# Patient Record
Sex: Male | Born: 1960 | Race: White | Hispanic: No | Marital: Married | Smoking: Former smoker
Health system: Southern US, Community
[De-identification: ages and names within clinical notes are randomized; demographics above are authoritative.]

## PROBLEM LIST (undated history)

## (undated) DIAGNOSIS — Z860100 Personal history of colon polyps, unspecified: Secondary | ICD-10-CM

## (undated) DIAGNOSIS — J449 Chronic obstructive pulmonary disease, unspecified: Secondary | ICD-10-CM

## (undated) DIAGNOSIS — J302 Other seasonal allergic rhinitis: Secondary | ICD-10-CM

## (undated) DIAGNOSIS — Z8719 Personal history of other diseases of the digestive system: Secondary | ICD-10-CM

## (undated) DIAGNOSIS — N433 Hydrocele, unspecified: Secondary | ICD-10-CM

## (undated) HISTORY — DX: Chronic obstructive pulmonary disease, unspecified: J44.9

## (undated) HISTORY — PX: COLONOSCOPY: SHX174

---
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
## 1999-02-05 ENCOUNTER — Ambulatory Visit (HOSPITAL_BASED_OUTPATIENT_CLINIC_OR_DEPARTMENT_OTHER): Admission: RE | Admit: 1999-02-05 | Discharge: 1999-02-05 | Payer: Self-pay | Admitting: Orthopedic Surgery

## 2005-07-18 ENCOUNTER — Ambulatory Visit (HOSPITAL_BASED_OUTPATIENT_CLINIC_OR_DEPARTMENT_OTHER): Admission: RE | Admit: 2005-07-18 | Discharge: 2005-07-18 | Payer: Self-pay | Admitting: Orthopedic Surgery

## 2005-07-18 ENCOUNTER — Encounter (INDEPENDENT_AMBULATORY_CARE_PROVIDER_SITE_OTHER): Payer: Self-pay | Admitting: *Deleted

## 2005-07-18 HISTORY — PX: OTHER SURGICAL HISTORY: SHX169

## 2007-05-28 ENCOUNTER — Encounter: Payer: Self-pay | Admitting: Family Medicine

## 2008-08-24 ENCOUNTER — Ambulatory Visit: Payer: Self-pay | Admitting: Family Medicine

## 2008-08-24 DIAGNOSIS — L84 Corns and callosities: Secondary | ICD-10-CM

## 2008-09-26 ENCOUNTER — Ambulatory Visit: Payer: Self-pay | Admitting: Family Medicine

## 2008-09-26 LAB — CONVERTED CEMR LAB
ALT: 18 units/L (ref 0–53)
AST: 20 units/L (ref 0–37)
Alkaline Phosphatase: 46 units/L (ref 39–117)
BUN: 15 mg/dL (ref 6–23)
Basophils Relative: 0.6 % (ref 0.0–3.0)
Bilirubin, Direct: 0 mg/dL (ref 0.0–0.3)
Chloride: 106 meq/L (ref 96–112)
Cholesterol: 190 mg/dL (ref 0–200)
Creatinine, Ser: 1.1 mg/dL (ref 0.4–1.5)
Eosinophils Relative: 2.3 % (ref 0.0–5.0)
GFR calc non Af Amer: 75.75 mL/min (ref >60)
Ketones, urine, test strip: NEGATIVE
LDL Cholesterol: 138 mg/dL — ABNORMAL HIGH (ref 0–99)
Lymphocytes Relative: 21.1 % (ref 12.0–46.0)
MCV: 96.2 fL (ref 78.0–100.0)
Monocytes Relative: 6.5 % (ref 3.0–12.0)
Neutrophils Relative %: 69.5 % (ref 43.0–77.0)
Nitrite: NEGATIVE
Platelets: 197 10*3/uL (ref 150.0–400.0)
Protein, U semiquant: NEGATIVE
RBC: 4.75 M/uL (ref 4.22–5.81)
Total Bilirubin: 0.6 mg/dL (ref 0.3–1.2)
Total CHOL/HDL Ratio: 5
Total Protein: 6.7 g/dL (ref 6.0–8.3)
Triglycerides: 58 mg/dL (ref 0.0–149.0)
Urobilinogen, UA: 0.2
VLDL: 11.6 mg/dL (ref 0.0–40.0)
WBC: 9.7 10*3/uL (ref 4.5–10.5)

## 2008-10-02 ENCOUNTER — Ambulatory Visit: Payer: Self-pay | Admitting: Family Medicine

## 2008-10-30 ENCOUNTER — Ambulatory Visit: Payer: Self-pay | Admitting: Family Medicine

## 2008-10-30 DIAGNOSIS — D239 Other benign neoplasm of skin, unspecified: Secondary | ICD-10-CM | POA: Insufficient documentation

## 2009-11-02 ENCOUNTER — Ambulatory Visit: Payer: Self-pay | Admitting: Family Medicine

## 2009-11-02 LAB — CONVERTED CEMR LAB
ALT: 20 units/L (ref 0–53)
AST: 19 units/L (ref 0–37)
BUN: 19 mg/dL (ref 6–23)
Basophils Relative: 0.8 % (ref 0.0–3.0)
Bilirubin, Direct: 0.1 mg/dL (ref 0.0–0.3)
Blood in Urine, dipstick: NEGATIVE
Eosinophils Relative: 2.8 % (ref 0.0–5.0)
GFR calc non Af Amer: 72.36 mL/min (ref >60)
Glucose, Urine, Semiquant: NEGATIVE
HCT: 45.5 % (ref 39.0–52.0)
HDL: 42 mg/dL (ref >39.00)
Monocytes Relative: 6.6 % (ref 3.0–12.0)
Neutrophils Relative %: 58.6 % (ref 43.0–77.0)
Nitrite: NEGATIVE
Platelets: 227 10*3/uL (ref 150.0–400.0)
Potassium: 5.3 meq/L — ABNORMAL HIGH (ref 3.5–5.1)
Protein, U semiquant: NEGATIVE
RBC: 4.71 M/uL (ref 4.22–5.81)
Total Bilirubin: 0.5 mg/dL (ref 0.3–1.2)
Total CHOL/HDL Ratio: 5
Urobilinogen, UA: 0.2
VLDL: 14.6 mg/dL (ref 0.0–40.0)
WBC Urine, dipstick: NEGATIVE
WBC: 8.8 10*3/uL (ref 4.5–10.5)
pH: 7.5

## 2009-11-08 ENCOUNTER — Ambulatory Visit: Payer: Self-pay | Admitting: Family Medicine

## 2010-02-19 NOTE — Assessment & Plan Note (Signed)
Summary: cpx/cjr   Vital Signs:  Patient profile:   50 year old male Height:      72.75 inches Weight:      223 pounds BMI:     29.73 Temp:     98.2 degrees F oral Pulse rate:   72 / minute Pulse rhythm:   regular Resp:     12 per minute BP sitting:   110 / 68  (left arm) Cuff size:   regular  Vitals Entered By: Sid Falcon LPN (November 08, 2009 10:45 AM)  Nutrition Counseling: Patient's BMI is greater than 25 and therefore counseled on weight management options.  History of Present Illness: Here for CPE.  Still smoking.  Will turn 50 later this year. Tetanus up to date. No regular exercise. Will receive flu vaccine today.   Both  parents deceased within past year and pt coping well.  Clinical Review Panels:  Immunizations   Last Tetanus Booster:  Tdap (10/02/2008)   Last Flu Vaccine:  Fluvax 3+ (11/08/2009)  Lipid Management   Cholesterol:  205 (11/02/2009)   LDL (bad choesterol):  138 (09/26/2008)   HDL (good cholesterol):  42.00 (11/02/2009)  Diabetes Management   Creatinine:  1.1 (11/02/2009)   Last Flu Vaccine:  Fluvax 3+ (11/08/2009)  CBC   WBC:  8.8 (11/02/2009)   RBC:  4.71 (11/02/2009)   Hgb:  15.5 (11/02/2009)   Hct:  45.5 (11/02/2009)   Platelets:  227.0 (11/02/2009)   MCV  96.6 (11/02/2009)   MCHC  34.1 (11/02/2009)   RDW  14.5 (11/02/2009)   PMN:  58.6 (11/02/2009)   Lymphs:  31.2 (11/02/2009)   Monos:  6.6 (11/02/2009)   Eosinophils:  2.8 (11/02/2009)   Basophil:  0.8 (11/02/2009)  Complete Metabolic Panel   Glucose:  107 (11/02/2009)   Sodium:  141 (11/02/2009)   Potassium:  5.3 (11/02/2009)   Chloride:  106 (11/02/2009)   CO2:  29 (11/02/2009)   BUN:  19 (11/02/2009)   Creatinine:  1.1 (11/02/2009)   Albumin:  3.9 (11/02/2009)   Total Protein:  6.6 (11/02/2009)   Calcium:  9.2 (11/02/2009)   Total Bili:  0.5 (11/02/2009)   Alk Phos:  53 (11/02/2009)   SGPT (ALT):  20 (11/02/2009)   SGOT (AST):  19 (11/02/2009)   Allergies: 1)   Penicillin V Potassium (Penicillin V Potassium)  Past History:  Past Medical History: Last updated: 08/24/2008 Chicken pox Hayfever/allergies  Family History: Last updated: 08/24/2008 Family History Breast cancer  Family History Diabetes  Family History of Prostate CA  Family history emotional illness  Social History: Last updated: 08/24/2008 Occupation:  Airline pilot  (paper) Current Smoker Alcohol use-yes  Risk Factors: Smoking Status: current (10/30/2008) Packs/Day: 1.0 (10/30/2008) PMH-FH-SH reviewed for relevance  Review of Systems  The patient denies anorexia, fever, weight loss, vision loss, decreased hearing, hoarseness, chest pain, syncope, dyspnea on exertion, peripheral edema, prolonged cough, headaches, hemoptysis, abdominal pain, melena, hematochezia, severe indigestion/heartburn, hematuria, incontinence, genital sores, muscle weakness, suspicious skin lesions, transient blindness, difficulty walking, depression, unusual weight change, abnormal bleeding, enlarged lymph nodes, and testicular masses.    Physical Exam  General:  Well-developed,well-nourished,in no acute distress; alert,appropriate and cooperative throughout examination Head:  Normocephalic and atraumatic without obvious abnormalities. No apparent alopecia or balding. Eyes:  pupils equal, pupils round, and pupils reactive to light.   Ears:  External ear exam shows no significant lesions or deformities.  Otoscopic examination reveals clear canals, tympanic membranes are intact bilaterally without bulging, retraction, inflammation or  discharge. Hearing is grossly normal bilaterally. Mouth:  Oral mucosa and oropharynx without lesions or exudates.  Teeth in good repair. Neck:  No deformities, masses, or tenderness noted. Lungs:  Normal respiratory effort, chest expands symmetrically. Lungs are clear to auscultation, no crackles or wheezes. Heart:  Normal rate and regular rhythm. S1 and S2 normal without gallop,  murmur, click, rub or other extra sounds. Abdomen:  Bowel sounds positive,abdomen soft and non-tender without masses, organomegaly or hernias noted. Rectal:  No external abnormalities noted. Normal sphincter tone. No rectal masses or tenderness. Prostate:  Prostate gland firm and smooth, no enlargement, nodularity, tenderness, mass, asymmetry or induration. Msk:  No deformity or scoliosis noted of thoracic or lumbar spine.   Extremities:  No clubbing, cyanosis, edema, or deformity noted with normal full range of motion of all joints.   Neurologic:  alert & oriented X3 and cranial nerves II-XII intact.   Skin:  no suspicious lesions.   Cervical Nodes:  No lymphadenopathy noted Psych:  Cognition and judgment appear intact. Alert and cooperative with normal attention span and concentration. No apparent delusions, illusions, hallucinations   Impression & Recommendations:  Problem # 1:  ROUTINE GENERAL MEDICAL EXAM@HEALTH  CARE FACL (ICD-V70.0) discussed smoking cessation.  Needs to exercise more.  Labs discussed and sig for prediabetes and mildly elev lipids. Set up appt for colonoscopy after 50. Flu vaccine given. Gastroenterology Referral (GI)  Complete Medication List: 1)  No Meds   Other Orders: Admin 1st Vaccine ([address]) Flu Vaccine 22yrs + (60454)   Orders Added: 1)  Admin 1st Vaccine [90471] 2)  Flu Vaccine 45yrs + [09811] 3)  Est. Patient 40-64 years [99396] 4)  Gastroenterology Referral [GI]   Flu Vaccine Consent Questions     Do you have a history of severe allergic reactions to this vaccine? no    Any prior history of allergic reactions to egg and/or gelatin? no    Do you have a sensitivity to the preservative Thimersol? no    Do you have a past history of Guillan-Barre Syndrome? no    Do you currently have an acute febrile illness? no    Have you ever had a severe reaction to latex? no    Vaccine information given and explained to patient? yes    Are you currently  pregnant? no    Lot Number:[id number]   Exp Date:07/20/2010   Site Given  Left Deltoid [email or web address]

## 2010-04-03 ENCOUNTER — Other Ambulatory Visit: Payer: Self-pay | Admitting: Family Medicine

## 2010-04-03 DIAGNOSIS — Z1211 Encounter for screening for malignant neoplasm of colon: Secondary | ICD-10-CM

## 2010-04-11 ENCOUNTER — Encounter (INDEPENDENT_AMBULATORY_CARE_PROVIDER_SITE_OTHER): Payer: Self-pay | Admitting: *Deleted

## 2010-04-18 NOTE — Letter (Signed)
Summary: Pre Visit Letter Revised  [facility]  [address] [place], [place] [address]   Phone: [phone]  Fax: [phone]        04/11/2010 MRN: [id number] [name] [address] [place], [place]  [address]             Procedure Date:  05/08/2010 @ 9:00   Direct colon-Dr. Jarold Motto   Welcome to the Gastroenterology Division at [facility].    You are scheduled to see a nurse for your pre-procedure visit on 04/24/2010  at 8:00 on the 3rd floor at [facility], [address]  We ask that you try to arrive at our office 15 minutes prior to your appointment time to allow for check-in.  Please take a minute to review the attached form.  If you answer "Yes" to one or more of the questions on the first page, we ask that you call the person listed at your earliest opportunity.  If you answer "No" to all of the questions, please complete the rest of the form and bring it to your appointment.    Your nurse visit will consist of discussing your medical and surgical history, your immediate family medical history, and your medications.   If you are unable to list all of your medications on the form, please bring the medication bottles to your appointment and we will list them.  We will need to be aware of both prescribed and over the counter drugs.  We will need to know exact dosage information as well.    Please be prepared to read and sign documents such as consent forms, a financial agreement, and acknowledgement forms.  If necessary, and with your consent, a friend or relative is welcome to sit-in on the nurse visit with you.  Please bring your insurance card so that we may make a copy of it.  If your insurance requires a referral to see a specialist, please bring your referral form from your primary care physician.  No co-pay is required for this nurse visit.     If you cannot keep your appointment, please call [phone] to cancel or reschedule prior to your  appointment date.  This allows [place] the opportunity to schedule an appointment for another patient in need of care.    Thank you for choosing [facility] for your medical needs.  We appreciate the opportunity to care for you.  Please visit [place] at our website  to learn more about our practice.  Sincerely, The Gastroenterology Division

## 2010-04-24 ENCOUNTER — Ambulatory Visit (AMBULATORY_SURGERY_CENTER): Payer: 59 | Admitting: *Deleted

## 2010-04-24 VITALS — Ht 72.5 in | Wt 233.0 lb

## 2010-04-24 DIAGNOSIS — Z1211 Encounter for screening for malignant neoplasm of colon: Secondary | ICD-10-CM

## 2010-04-24 MED ORDER — PEG-KCL-NACL-NASULF-NA ASC-C 100 G PO SOLR: ORAL | Status: DC

## 2010-05-07 ENCOUNTER — Encounter: Payer: Self-pay | Admitting: Gastroenterology

## 2010-05-08 ENCOUNTER — Encounter: Payer: Self-pay | Admitting: Gastroenterology

## 2010-05-08 ENCOUNTER — Ambulatory Visit (AMBULATORY_SURGERY_CENTER): Payer: 59 | Admitting: Gastroenterology

## 2010-05-08 VITALS — BP 116/71 | HR 48 | Temp 95.9°F | Resp 11 | Ht 72.0 in | Wt 225.0 lb

## 2010-05-08 DIAGNOSIS — D126 Benign neoplasm of colon, unspecified: Secondary | ICD-10-CM

## 2010-05-08 DIAGNOSIS — K5730 Diverticulosis of large intestine without perforation or abscess without bleeding: Secondary | ICD-10-CM

## 2010-05-08 DIAGNOSIS — K620 Anal polyp: Secondary | ICD-10-CM

## 2010-05-08 DIAGNOSIS — Z1211 Encounter for screening for malignant neoplasm of colon: Secondary | ICD-10-CM

## 2010-05-08 MED ORDER — SODIUM CHLORIDE 0.9 % IV SOLN: 500.0000 mL | INTRAVENOUS | Status: DC

## 2010-05-08 NOTE — Patient Instructions (Signed)
Discharge instructions given  Findings: Diverticulosis-Handout given Polyps-Handout given  Please eat a diet that is high in fiber. We recommend using Metamucil or Benefiber  You may resume your medications as you would normally take them  Repeat Exam in 3 years(2015)  You will receive a letter in the mail within 2 weeks regarding the results of your pathology.

## 2010-05-09 ENCOUNTER — Telehealth: Payer: Self-pay | Admitting: *Deleted

## 2010-05-09 NOTE — Telephone Encounter (Signed)

## 2010-05-13 ENCOUNTER — Encounter: Payer: Self-pay | Admitting: Gastroenterology

## 2010-06-07 NOTE — Op Note (Signed)
NAME:  [name], Carlos                   ACCOUNT NO.:  [id number]   MEDICAL RECORD NO.:  [id number]          PATIENT TYPE:  AMB   LOCATION:  DSC                          FACILITY:  [facility]   PHYSICIAN:  Cindee Salt, M.D.       DATE OF BIRTH:  03-21-1960   DATE OF PROCEDURE:  07/18/2005  DATE OF DISCHARGE:                                 OPERATIVE REPORT   PREOPERATIVE DIAGNOSIS:  Foreign body and foreign body granuloma right hand.   POSTOPERATIVE DIAGNOSIS:  Foreign body and foreign body granuloma right  hand.   OPERATION:  Excision of foreign body, with partial excision of foreign body  granuloma right hand, index finger radial aspect, intimately involved with  the digital nerve and artery.   SURGEON:  Cindee Salt, M.D.   ANESTHESIA:  Forearm-based IV regional.   HISTORY:  The patient is a 50 year old male with a history of a piece of  wood in his right hand.  This is directly at where the neurovascular bundle  is at the metacarpophalangeal joint radial aspect index finger.  He states  that he had significant pain and paresthesias distally.  These have abated.  The foreign body has been present for approximately 1 month.   PROCEDURE:  Preoperatively, the patient was seen and questions answered.  He  aware of potential risk to the artery and nerve, stiffness of the finger,  loss of sensibility, and infection. The area was marked by both patient and  surgeon, questions encouraged and then answered.    The patient was taken to the operating room where a forearm-based IV  regional anesthetic was carried out without difficulty.  He was prepped  using DuraPrep, supine position, right arm free.  After a 3-minute dry time,  he was draped. After adequate anesthesia was afforded to the patient, an  oblique incision was made directly over the foreign body.  This was carried  down into the subcutaneous tissue with blunt and sharp dissection.  The  digital artery and nerve were identified.  The  foreign body was noted to be  intimate with the neurovascular bundle. A large piece of wood was then  removed with blunt sharp dissection. The entire granuloma could not be  removed from the digital artery and nerve.  It was decided to leave a  portion behind rather than risking injury to the artery and nerve in that he  had near normal sensation. The foreign body granuloma was removed on either  side of the neurovascular bundle, leaving a small portion the width of the  neurovascular bundle present dorsal to the neurovascular bundle. The  specimen was sent to pathology.  No gross infection was noted.  The wound  was irrigated.  The skin was loosely  closed with interrupted 5-0 nylon sutures.  Sterile compressive dressing was  applied.  The patient tolerated the procedure well was taken to the recovery  room for observation in satisfactory condition.  He is discharged home to  return to the [facility] in 1 week on Vicodin.  ______________________________  Cindee Salt, M.D.     GK/MEDQ  D:  07/18/2005  T:  07/18/2005  Job:  [id number]   cc:   Teena Irani. Arlyce Dice, M.D.  Fax: [phone]

## 2010-07-17 ENCOUNTER — Encounter: Payer: Self-pay | Admitting: Family Medicine

## 2010-07-17 ENCOUNTER — Ambulatory Visit (INDEPENDENT_AMBULATORY_CARE_PROVIDER_SITE_OTHER): Payer: 59 | Admitting: Family Medicine

## 2010-07-17 VITALS — BP 110/70 | Temp 98.7°F

## 2010-07-17 DIAGNOSIS — L0390 Cellulitis, unspecified: Secondary | ICD-10-CM

## 2010-07-17 DIAGNOSIS — L02519 Cutaneous abscess of unspecified hand: Secondary | ICD-10-CM

## 2010-07-17 MED ORDER — CEPHALEXIN 500 MG PO CAPS: 500.0000 mg | ORAL_CAPSULE | Freq: Three times a day (TID) | ORAL | Status: AC

## 2010-07-17 NOTE — Progress Notes (Signed)
  Subjective:    Patient ID: [name], male    DOB: [birth year], 50 y.o.   MRN: [id number]  HPI Puncture wound with copper wire right ring finger yesterday. Today noticed some swelling distal to DIP joint. He thinks puncture was fairly deep. Tetanus 2010. No fever or chills. Some clear drainage. Reported history of penicillin allergy as a child but no history of anaphylaxis   Review of Systems  Constitutional: Negative for fever and chills.       Objective:   Physical Exam  Constitutional: He appears well-developed and well-nourished.  Cardiovascular: Normal rate, regular rhythm and normal heart sounds.   Musculoskeletal:       Right ring finger reveals small puncture ulnar side just distal to DIP joint. He has mild erythema and swelling distal to the DIP joint. Full range of motion all joints the finger. No obvious foreign body palpated. No warmth. No purulence and no fluctuance          Assessment & Plan:  Probable early cellulitis right ring finger following puncture wound. Tetanus up-to-date. Cephalexin 500 mg 3 times a day for 10 days. Follow up promptly if not improving consider x-rays

## 2010-07-17 NOTE — Patient Instructions (Signed)
Follow up immediately for any increased redness, swelling, or fever.

## 2010-08-12 ENCOUNTER — Encounter: Payer: Self-pay | Admitting: Family Medicine

## 2010-08-12 ENCOUNTER — Ambulatory Visit (INDEPENDENT_AMBULATORY_CARE_PROVIDER_SITE_OTHER): Payer: 59 | Admitting: Family Medicine

## 2010-08-12 VITALS — BP 110/78 | Temp 99.0°F | Wt 238.0 lb

## 2010-08-12 DIAGNOSIS — R509 Fever, unspecified: Secondary | ICD-10-CM

## 2010-08-12 LAB — CBC WITH DIFFERENTIAL/PLATELET
Basophils Absolute: 0 10*3/uL (ref 0.0–0.1)
Eosinophils Absolute: 0 10*3/uL (ref 0.0–0.7)
Lymphocytes Relative: 26.9 % (ref 12.0–46.0)
MCHC: 34.2 g/dL (ref 30.0–36.0)
Neutro Abs: 2.4 10*3/uL (ref 1.4–7.7)
Neutrophils Relative %: 58 % (ref 43.0–77.0)
RDW: 13.9 % (ref 11.5–14.6)

## 2010-08-12 LAB — POCT URINALYSIS DIPSTICK
Glucose, UA: NEGATIVE
Nitrite, UA: NEGATIVE
Protein, UA: NEGATIVE
Urobilinogen, UA: 0.2

## 2010-08-12 NOTE — Progress Notes (Signed)
This encounter was created in error - please disregard.

## 2010-08-12 NOTE — Progress Notes (Signed)
  Subjective:    Patient ID: [name], male    DOB: 02-22-1960, 50 y.o.   MRN: [id number]  HPI Patient seen 3-4 day history of malaise and low-grade fever up to 101. Diffuse body aches but no other specific symptoms. Occasional rare cough. Ibuprofen helps relieve fever. No recent tick bite did have several tick bites back in April. He does have some intermittent arthralgias of the hands but no other generalized arthralgias. He denies any postnasal drip, local facial pain, urinary symptoms, abdominal pain, nausea, vomiting, or diarrhea. No recent skin rash. No headaches.   Review of Systems  Constitutional: Positive for fever and chills. Negative for fatigue.  HENT: Negative for sore throat.   Respiratory: Negative for shortness of breath.   Cardiovascular: Negative for chest pain, palpitations and leg swelling.  Gastrointestinal: Negative for vomiting, abdominal pain and diarrhea.  Genitourinary: Negative for dysuria.  Skin: Negative for rash.  Neurological: Negative for headaches.  Hematological: Negative for adenopathy. Does not bruise/bleed easily.       Objective:   Physical Exam  Constitutional: He appears well-developed and well-nourished.  HENT:  Right Ear: External ear normal.  Left Ear: External ear normal.  Mouth/Throat: Oropharynx is clear and moist. No oropharyngeal exudate.  Neck: Neck supple. No thyromegaly present.  Cardiovascular: Normal rate, regular rhythm and normal heart sounds.   No murmur heard. Pulmonary/Chest: Effort normal and breath sounds normal. No respiratory distress. He has no wheezes. He has no rales.  Abdominal: Soft. He exhibits no distension and no mass. There is no tenderness. There is no rebound and no guarding.  Musculoskeletal: He exhibits no edema.  Lymphadenopathy:    He has no cervical adenopathy.  Skin: No rash noted.          Assessment & Plan:  Febrile illness. Nonfocal exam. Question viral. Given lack of symptoms check urinalysis  and CBC. History of multiple recent tick bites. No erythema chronicum migrans rash reported. Patient requests Lyme antibody tests which seems reasonable.  Urine dip 1 plus blood.  Pt needs f/u 1-2 weeks to repeat UA.  Lyme Ab pending.  CBC slightly high monocytes.  Suspect viral illness. Pt notified.

## 2010-08-12 NOTE — Patient Instructions (Signed)
Follow up promptly for any new symptoms or if fever persists another 2-3 days.

## 2010-08-16 NOTE — Progress Notes (Signed)
Quick Note:  Pt informed on home personally identified VM ______ 

## 2010-09-04 ENCOUNTER — Other Ambulatory Visit (INDEPENDENT_AMBULATORY_CARE_PROVIDER_SITE_OTHER): Payer: 59

## 2010-09-04 DIAGNOSIS — Z0000 Encounter for general adult medical examination without abnormal findings: Secondary | ICD-10-CM

## 2010-09-04 LAB — CBC WITH DIFFERENTIAL/PLATELET
Basophils Relative: 0.6 % (ref 0.0–3.0)
Eosinophils Relative: 2.6 % (ref 0.0–5.0)
HCT: 44.5 % (ref 39.0–52.0)
Lymphs Abs: 3 10*3/uL (ref 0.7–4.0)
MCV: 94.7 fl (ref 78.0–100.0)
Monocytes Relative: 6.9 % (ref 3.0–12.0)
Platelets: 214 10*3/uL (ref 150.0–400.0)
RBC: 4.7 Mil/uL (ref 4.22–5.81)
WBC: 8.4 10*3/uL (ref 4.5–10.5)

## 2010-09-04 LAB — POCT URINALYSIS DIPSTICK
Leukocytes, UA: NEGATIVE
Nitrite, UA: NEGATIVE
Protein, UA: NEGATIVE
Urobilinogen, UA: 0.2
pH, UA: 7.5

## 2010-09-04 LAB — LIPID PANEL
Cholesterol: 195 mg/dL (ref 0–200)
LDL Cholesterol: 137 mg/dL — ABNORMAL HIGH (ref 0–99)
Total CHOL/HDL Ratio: 4
Triglycerides: 67 mg/dL (ref 0.0–149.0)
VLDL: 13.4 mg/dL (ref 0.0–40.0)

## 2010-09-04 LAB — HEPATIC FUNCTION PANEL
Albumin: 4 g/dL (ref 3.5–5.2)
Total Protein: 6.7 g/dL (ref 6.0–8.3)

## 2010-09-04 LAB — BASIC METABOLIC PANEL
BUN: 18 mg/dL (ref 6–23)
Chloride: 106 mEq/L (ref 96–112)
GFR: 81.99 mL/min (ref >60.00)
Potassium: 4.9 mEq/L (ref 3.5–5.1)
Sodium: 143 mEq/L (ref 135–145)

## 2010-09-04 LAB — PSA: PSA: 0.33 ng/mL (ref 0.10–4.00)

## 2010-09-18 ENCOUNTER — Encounter: Payer: Self-pay | Admitting: Family Medicine

## 2010-09-18 ENCOUNTER — Ambulatory Visit (INDEPENDENT_AMBULATORY_CARE_PROVIDER_SITE_OTHER): Payer: 59 | Admitting: Family Medicine

## 2010-09-18 DIAGNOSIS — D126 Benign neoplasm of colon, unspecified: Secondary | ICD-10-CM

## 2010-09-18 DIAGNOSIS — K635 Polyp of colon: Secondary | ICD-10-CM

## 2010-09-18 NOTE — Patient Instructions (Signed)
Try to lose some weight and establish more consistent exercise.   

## 2010-09-18 NOTE — Progress Notes (Signed)
  Subjective:    Patient ID: [name], male    DOB: 08-26-1960, 50 y.o.   MRN: [id number]  HPI Patient here for complete physical examination. Quit smoking last November. Takes no medications. Allergy to penicillin. Tetanus up-to-date. Had colonoscopy earlier this year with adenomatous polyps. No regular exercise. Has had some mild weight gain recently.  Past Medical History  Diagnosis Date  . Allergy     seasonal  . 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 CORNS AND CALLUSES 08/24/2008   Past Surgical History  Procedure Date  . Ankle fracture surgery     left ankle    reports that he quit smoking about 9 months ago. His smoking use included Cigarettes. He has a 35 pack-year smoking history. He has never used smokeless tobacco. He reports that he drinks about 3.6 ounces of alcohol per week. He reports that he does not use illicit drugs. family history includes Cancer in his other; Diabetes in his other; and Mental illness in his other. Allergies  Allergen Reactions  . Penicillins     REACTION: rash as a child      Review of Systems  Constitutional: Negative for fever, activity change, appetite change and fatigue.  HENT: Negative for ear pain, congestion and trouble swallowing.   Eyes: Negative for pain and visual disturbance.  Respiratory: Negative for cough, shortness of breath and wheezing.   Cardiovascular: Negative for chest pain and palpitations.  Gastrointestinal: Negative for nausea, vomiting, abdominal pain, diarrhea, constipation, blood in stool, abdominal distention and rectal pain.  Genitourinary: Negative for dysuria, hematuria and testicular pain.  Musculoskeletal: Negative for joint swelling and arthralgias.  Skin: Negative for rash.  Neurological: Negative for dizziness, syncope and headaches.  Hematological: Negative for adenopathy.  Psychiatric/Behavioral: Negative for confusion and dysphoric mood.       Objective:   Physical Exam  Constitutional: He is oriented to  person, place, and time. He appears well-developed and well-nourished. No distress.  HENT:  Head: Normocephalic and atraumatic.  Right Ear: External ear normal.  Left Ear: External ear normal.  Mouth/Throat: Oropharynx is clear and moist.  Eyes: Conjunctivae and EOM are normal. Pupils are equal, round, and reactive to light.  Neck: Normal range of motion. Neck supple. No thyromegaly present.  Cardiovascular: Normal rate, regular rhythm and normal heart sounds.   No murmur heard. Pulmonary/Chest: No respiratory distress. He has no wheezes. He has no rales.  Abdominal: Soft. Bowel sounds are normal. He exhibits no distension and no mass. There is no tenderness. There is no rebound and no guarding.  Musculoskeletal: He exhibits no edema.  Lymphadenopathy:    He has no cervical adenopathy.  Neurological: He is alert and oriented to person, place, and time. He displays normal reflexes. No cranial nerve deficit.  Skin: No rash noted.  Psychiatric: He has a normal mood and affect.          Assessment & Plan:  Complete physical. Recently quit smoking. Labs reviewed with patient. He has prediabetes with fasting glucose 124. Needs to work on weight loss and more regular exercise. Tetanus up-to-date.  Reminder for flu vaccine. Consider followup fasting glucose in 6 months

## 2010-10-07 ENCOUNTER — Telehealth: Payer: Self-pay | Admitting: *Deleted

## 2010-10-07 MED ORDER — FLUTICASONE PROPIONATE 50 MCG/ACT NA SUSP: 2.0000 | Freq: Every day | NASAL | Status: DC

## 2010-10-07 NOTE — Telephone Encounter (Signed)
Flonase 2 sprays per nostril once daily, refill 6 times

## 2010-10-07 NOTE — Telephone Encounter (Signed)
Pt would like either Beconase or Flonase for seasonal allergy symptoms called to [facility]) Has used these before with success.

## 2010-10-07 NOTE — Telephone Encounter (Signed)
Notified pt. 

## 2010-11-01 ENCOUNTER — Ambulatory Visit (INDEPENDENT_AMBULATORY_CARE_PROVIDER_SITE_OTHER): Payer: 59

## 2010-11-01 DIAGNOSIS — Z23 Encounter for immunization: Secondary | ICD-10-CM

## 2011-11-04 ENCOUNTER — Ambulatory Visit (INDEPENDENT_AMBULATORY_CARE_PROVIDER_SITE_OTHER): Payer: 59

## 2011-11-04 DIAGNOSIS — Z23 Encounter for immunization: Secondary | ICD-10-CM

## 2011-12-01 ENCOUNTER — Other Ambulatory Visit (INDEPENDENT_AMBULATORY_CARE_PROVIDER_SITE_OTHER): Payer: 59

## 2011-12-01 DIAGNOSIS — Z0000 Encounter for general adult medical examination without abnormal findings: Secondary | ICD-10-CM

## 2011-12-01 LAB — BASIC METABOLIC PANEL
Calcium: 8.7 mg/dL (ref 8.4–10.5)
Creatinine, Ser: 0.9 mg/dL (ref 0.4–1.5)
GFR: 89.65 mL/min (ref >60.00)

## 2011-12-01 LAB — POCT URINALYSIS DIPSTICK
Bilirubin, UA: NEGATIVE
Blood, UA: NEGATIVE
Glucose, UA: NEGATIVE
Ketones, UA: NEGATIVE
Nitrite, UA: NEGATIVE
Spec Grav, UA: 1.015

## 2011-12-01 LAB — LIPID PANEL
HDL: 45.7 mg/dL (ref >39.00)
Total CHOL/HDL Ratio: 4
Triglycerides: 60 mg/dL (ref 0.0–149.0)
VLDL: 12 mg/dL (ref 0.0–40.0)

## 2011-12-01 LAB — CBC WITH DIFFERENTIAL/PLATELET
Basophils Relative: 0.8 % (ref 0.0–3.0)
Eosinophils Absolute: 0.3 10*3/uL (ref 0.0–0.7)
Eosinophils Relative: 3.6 % (ref 0.0–5.0)
Lymphocytes Relative: 32.4 % (ref 12.0–46.0)
MCHC: 33.3 g/dL (ref 30.0–36.0)
Monocytes Relative: 6.7 % (ref 3.0–12.0)
Neutrophils Relative %: 56.5 % (ref 43.0–77.0)
RBC: 4.66 Mil/uL (ref 4.22–5.81)
WBC: 7.9 10*3/uL (ref 4.5–10.5)

## 2011-12-01 LAB — HEPATIC FUNCTION PANEL
Alkaline Phosphatase: 47 U/L (ref 39–117)
Bilirubin, Direct: 0.1 mg/dL (ref 0.0–0.3)
Total Protein: 6.3 g/dL (ref 6.0–8.3)

## 2011-12-01 LAB — PSA: PSA: 0.3 ng/mL (ref 0.10–4.00)

## 2011-12-08 ENCOUNTER — Ambulatory Visit (INDEPENDENT_AMBULATORY_CARE_PROVIDER_SITE_OTHER): Payer: 59 | Admitting: Family Medicine

## 2011-12-08 ENCOUNTER — Encounter: Payer: Self-pay | Admitting: Family Medicine

## 2011-12-08 VITALS — BP 110/68 | HR 72 | Temp 98.0°F | Resp 12 | Ht 72.75 in | Wt 237.0 lb

## 2011-12-08 DIAGNOSIS — Z0000 Encounter for general adult medical examination without abnormal findings: Secondary | ICD-10-CM

## 2011-12-08 MED ORDER — ALBUTEROL SULFATE HFA 108 (90 BASE) MCG/ACT IN AERS: 2.0000 | INHALATION_SPRAY | RESPIRATORY_TRACT | Status: DC | PRN

## 2011-12-08 NOTE — Progress Notes (Signed)
  Subjective:    Patient ID: [name], male    DOB: [birth year], 51 y.o.   MRN: [id number]  HPI  Complete physical. Patient quit smoking 3 years ago. He's been inconsistent with exercise. Tetanus 2010. Colonoscopy 2012. No major complaints today with exception that he occasionally wakes up feeling slightly short of breath at night-occasionally. No observed apnea and no daytime somnolenc or other suggestion of apnea. Has never had associated chest pain. Sometimes feels like he has increased mucus. Usually no cough. No hoarseness. Has never had any chest pains with exercise. No exertional dyspnea.  Past Medical History  Diagnosis Date  . Allergy     seasonal  . 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 CORNS AND CALLUSES 08/24/2008   Past Surgical History  Procedure Date  . Ankle fracture surgery     left ankle    reports that he quit smoking about 1 years ago. His smoking use included Cigarettes. He has a 35 pack-year smoking history. He has never used smokeless tobacco. He reports that he drinks about 3.6 ounces of alcohol per week. He reports that he does not use illicit drugs. family history includes Cancer in his other; Diabetes in his other; and Mental illness in his other. Allergies  Allergen Reactions  . Penicillins     REACTION: rash as a child      Review of Systems  Constitutional: Negative for fever, activity change, appetite change and fatigue.  HENT: Negative for ear pain, congestion and trouble swallowing.   Eyes: Negative for pain and visual disturbance.  Respiratory: Negative for cough, shortness of breath and wheezing.   Cardiovascular: Negative for chest pain and palpitations.  Gastrointestinal: Negative for nausea, vomiting, abdominal pain, diarrhea, constipation, blood in stool, abdominal distention and rectal pain.  Genitourinary: Negative for dysuria, hematuria and testicular pain.  Musculoskeletal: Negative for joint swelling and arthralgias.  Skin: Negative for rash.    Neurological: Negative for dizziness, syncope and headaches.  Hematological: Negative for adenopathy.  Psychiatric/Behavioral: Negative for confusion and dysphoric mood.       Objective:   Physical Exam  Constitutional: He is oriented to person, place, and time. He appears well-developed and well-nourished. No distress.  HENT:  Head: Normocephalic and atraumatic.  Right Ear: External ear normal.  Left Ear: External ear normal.  Mouth/Throat: Oropharynx is clear and moist.  Eyes: Conjunctivae normal and EOM are normal. Pupils are equal, round, and reactive to light.  Neck: Normal range of motion. Neck supple. No thyromegaly present.  Cardiovascular: Normal rate, regular rhythm and normal heart sounds.   No murmur heard. Pulmonary/Chest: No respiratory distress. He has no wheezes. He has no rales.  Abdominal: Soft. Bowel sounds are normal. He exhibits no distension and no mass. There is no tenderness. There is no rebound and no guarding.  Musculoskeletal: He exhibits no edema.  Lymphadenopathy:    He has no cervical adenopathy.  Neurological: He is alert and oriented to person, place, and time. He displays normal reflexes. No cranial nerve deficit.  Skin: No rash noted.  Psychiatric: He has a normal mood and affect.          Assessment & Plan:  Complete physical. Colonoscopy last year with colon polyps noted. Tetanus up-to-date. Flu vaccine already given. Discussed establishing more consistent exercise. Question reactive airway issues intermittently at night.. Trial of albuterol 2 puffs with recurrence. Consider spirometry if persists.  We also discussed things like GERD but no obvious symptoms.

## 2011-12-08 NOTE — Patient Instructions (Addendum)
Try to get back to establishing regular exercise. Let me know if respiratory symptoms not improved with albuterol.

## 2012-03-15 ENCOUNTER — Encounter: Payer: Self-pay | Admitting: Family Medicine

## 2012-03-15 ENCOUNTER — Ambulatory Visit (INDEPENDENT_AMBULATORY_CARE_PROVIDER_SITE_OTHER): Payer: 59 | Admitting: Family Medicine

## 2012-03-15 VITALS — BP 110/70 | Temp 98.9°F | Wt 236.0 lb

## 2012-03-15 MED ORDER — TYPHOID VACCINE PO CPDR: 1.0000 | DELAYED_RELEASE_CAPSULE | ORAL | Status: DC

## 2012-03-15 MED ORDER — ATOVAQUONE-PROGUANIL HCL 250-100 MG PO TABS: ORAL_TABLET | ORAL | Status: DC

## 2012-03-15 NOTE — Progress Notes (Signed)
  Subjective:    Patient ID: [name], male    DOB: December 20, 1960, 52 y.o.   MRN: [id number]  HPI Travel medicine consultation. Patient plans to travel in several months to [place] and [place]. He plans to be there approximately 10 days. No history of hepatitis a vaccine. 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 Tetanus is up-to-date. He'll also need typhoid and malaria prevention.  Past Medical History  Diagnosis Date  . Allergy     seasonal  . 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 CORNS AND CALLUSES 08/24/2008   Past Surgical History  Procedure Laterality Date  . Ankle fracture surgery      left ankle    reports that he quit smoking about 2 years ago. His smoking use included Cigarettes. He has a 35 pack-year smoking history. He has never used smokeless tobacco. He reports that he drinks about 3.6 ounces of alcohol per week. He reports that he does not use illicit drugs. family history includes Cancer in his other; Diabetes in his other; and Mental illness in his other. Allergies  Allergen Reactions  . Penicillins     REACTION: rash as a child      Review of Systems  Constitutional: Negative for fever, chills, appetite change and unexpected weight change.  Respiratory: Negative for cough and shortness of breath.        Objective:   Physical Exam  Constitutional: He appears well-developed and well-nourished. No distress.  Cardiovascular: Normal rate and regular rhythm.   Pulmonary/Chest: Effort normal and breath sounds normal. No respiratory distress. He has no wheezes. He has no rales.          Assessment & Plan:  Travel medicine consultation. Hepatitis A given. Tetanus up-to-date. We've offered hepatitis B but he has no specific risk factors. Oral typhoid vaccine given. He has no contraindications. Malarone for malaria prevention which he will start 1 day prior to travel and continue for one week following travel and daily  during travel

## 2012-06-10 ENCOUNTER — Ambulatory Visit (INDEPENDENT_AMBULATORY_CARE_PROVIDER_SITE_OTHER): Payer: 59 | Admitting: Family Medicine

## 2012-06-10 ENCOUNTER — Encounter: Payer: Self-pay | Admitting: Family Medicine

## 2012-06-10 VITALS — BP 110/70 | Temp 98.8°F | Wt 237.0 lb

## 2012-06-10 DIAGNOSIS — Z7189 Other specified counseling: Secondary | ICD-10-CM

## 2012-06-10 DIAGNOSIS — Z7184 Encounter for health counseling related to travel: Secondary | ICD-10-CM

## 2012-06-10 DIAGNOSIS — R609 Edema, unspecified: Secondary | ICD-10-CM

## 2012-06-10 DIAGNOSIS — R600 Localized edema: Secondary | ICD-10-CM

## 2012-06-10 MED ORDER — CIPROFLOXACIN HCL 500 MG PO TABS: 500.0000 mg | ORAL_TABLET | Freq: Two times a day (BID) | ORAL | Status: DC

## 2012-06-10 MED ORDER — ONDANSETRON HCL 8 MG PO TABS: 8.0000 mg | ORAL_TABLET | Freq: Three times a day (TID) | ORAL | Status: DC | PRN

## 2012-06-10 MED ORDER — ZOLPIDEM TARTRATE 10 MG PO TABS: 10.0000 mg | ORAL_TABLET | Freq: Every evening | ORAL | Status: DC | PRN

## 2012-06-10 NOTE — Patient Instructions (Addendum)
Go ahead and start Typhoid vaccine now.

## 2012-06-10 NOTE — Progress Notes (Signed)
  Subjective:    Patient ID: [name], male    DOB: [birth year], 52 y.o.   MRN: [id number]  HPI Patient here to discuss travel issues. We'll leave in June for [place] in [place] Patient has already had hepatitis A vaccine. Tetanus is up to date. Declines hepatitis B vaccine He has not yet started typhoid vaccine. He has Malarone to take for malaria prevention He is inquiring about something to take in case he develops any nausea or traveler's diarrhea. Also inquiring about medications for possible insomnia with adapting to different time zone.  Generally feels well. Had some recent mild bilateral hand edema mostly early morning. Generally minimizes sodium intake. No leg edema. No dyspnea. No localized swelling.  Past Medical History  Diagnosis Date  . Allergy     seasonal  . 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 CORNS AND CALLUSES 08/24/2008   Past Surgical History  Procedure Laterality Date  . Ankle fracture surgery      left ankle    reports that he quit smoking about 2 years ago. His smoking use included Cigarettes. He has a 35 pack-year smoking history. He has never used smokeless tobacco. He reports that he drinks about 3.6 ounces of alcohol per week. He reports that he does not use illicit drugs. family history includes Cancer in his other; Diabetes in his other; and Mental illness in his other. Allergies  Allergen Reactions  . Penicillins     REACTION: rash as a child   .    Review of Systems  Constitutional: Negative for fever, chills, appetite change, fatigue and unexpected weight change.  Respiratory: Negative for shortness of breath.   Cardiovascular: Negative for chest pain and leg swelling.  Musculoskeletal: Negative for joint swelling.  Skin: Negative for rash.  Neurological: Negative for dizziness and headaches.       Objective:   Physical Exam  Constitutional: He appears well-developed and well-nourished.  Cardiovascular: Normal rate and regular rhythm.   Exam reveals no gallop.   Pulmonary/Chest: Effort normal and breath sounds normal. No respiratory distress. He has no wheezes. He has no rales.  Musculoskeletal: He exhibits no edema.  No visible hand edema this time. No focal joint swelling. No erythema. No warmth.          Assessment & Plan:  #1 travel medicine consultation. Wrote a prescription for limited Ambien as needed for sleep. Zofran 8 mg every 8 hours as needed for nausea/vomiting. Cipro 500 mg twice a day for 3 days as needed for traveler's diarrhea. He is encouraged to go ahead and start typhoid oral vaccine at this time.  He is aware not to overlap this vaccine with any antibiotics or antimalarials. #2 reported bilateral hand edema. No significant edema noted at this time. Watch sodium intake.

## 2012-06-15 ENCOUNTER — Ambulatory Visit (INDEPENDENT_AMBULATORY_CARE_PROVIDER_SITE_OTHER): Payer: 59 | Admitting: Family Medicine

## 2012-06-15 ENCOUNTER — Telehealth: Payer: Self-pay | Admitting: Family Medicine

## 2012-06-15 VITALS — BP 120/70 | Temp 98.5°F

## 2012-06-15 DIAGNOSIS — K610 Anal abscess: Secondary | ICD-10-CM

## 2012-06-15 DIAGNOSIS — K612 Anorectal abscess: Secondary | ICD-10-CM

## 2012-06-15 NOTE — Patient Instructions (Signed)
Abscess An abscess is an infected area that contains a collection of pus and debris.It can occur in almost any part of the body. An abscess is also known as a furuncle or boil. CAUSES  An abscess occurs when tissue gets infected. This can occur from blockage of oil or sweat glands, infection of hair follicles, or a minor injury to the skin. As the body tries to fight the infection, pus collects in the area and creates pressure under the skin. This pressure causes pain. People with weakened immune systems have difficulty fighting infections and get certain abscesses more often.  SYMPTOMS Usually an abscess develops on the skin and becomes a painful mass that is red, warm, and tender. If the abscess forms under the skin, you may feel a moveable soft area under the skin. Some abscesses break open (rupture) on their own, but most will continue to get worse without care. The infection can spread deeper into the body and eventually into the bloodstream, causing you to feel ill.  DIAGNOSIS  Your caregiver will take your medical history and perform a physical exam. A sample of fluid may also be taken from the abscess to determine what is causing your infection. TREATMENT  Your caregiver may prescribe antibiotic medicines to fight the infection. However, taking antibiotics alone usually does not cure an abscess. Your caregiver may need to make a small cut (incision) in the abscess to drain the pus. In some cases, gauze is packed into the abscess to reduce pain and to continue draining the area. HOME CARE INSTRUCTIONS   Only take over-the-counter or prescription medicines for pain, discomfort, or fever as directed by your caregiver.  If you were prescribed antibiotics, take them as directed. Finish them even if you start to feel better.  If gauze is used, follow your caregiver's directions for changing the gauze.  To avoid spreading the infection:  Keep your draining abscess covered with a  bandage.  Wash your hands well.  Do not share personal care items, towels, or whirlpools with others.  Avoid skin contact with others.  Keep your skin and clothes clean around the abscess.  Keep all follow-up appointments as directed by your caregiver. SEEK MEDICAL CARE IF:   You have increased pain, swelling, redness, fluid drainage, or bleeding.  You have muscle aches, chills, or a general ill feeling.  You have a fever. MAKE SURE YOU:   Understand these instructions.  Will watch your condition.  Will get help right away if you are not doing well or get worse. Document Released: 10/16/2004 Document Revised: 07/08/2011 Document Reviewed: 03/21/2011 [facility] Patient Information 2014 [place], [place].  Keep area dry and return tomorrow for re-evaluation.

## 2012-06-15 NOTE — Progress Notes (Signed)
  Subjective:    Patient ID: Crixus Mcaulay, male    DOB: May 12, 1960, 52 y.o.   MRN: [id number]  HPI Patient seen for perianal pain. This started over week ago and symptoms initially very mild. This past weekend he went to [facility] and was started on topical lidocaine and given oral Vicodin. He thought initially this represented external hemorrhoids though none were noted. Apparently, no visible anal fissure. Patient has not had any bleeding. No constipation until starting Vicodin. He states he developed fever 101 Sunday night but none since then. His pain has progressed since then. No perirectal drainage  Past Medical History  Diagnosis Date  . Allergy     seasonal  . 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 CORNS AND CALLUSES 08/24/2008   Past Surgical History  Procedure Laterality Date  . Ankle fracture surgery      left ankle    reports that he quit smoking about 2 years ago. His smoking use included Cigarettes. He has a 35 pack-year smoking history. He has never used smokeless tobacco. He reports that he drinks about 3.6 ounces of alcohol per week. He reports that he does not use illicit drugs. family history includes Cancer in his other; Diabetes in his other; and Mental illness in his other. Allergies  Allergen Reactions  . Penicillins     REACTION: rash as a child      Review of Systems  Constitutional: Positive for fever and fatigue. Negative for chills.  Gastrointestinal: Positive for constipation. Negative for nausea, vomiting, abdominal pain, diarrhea and blood in stool.       Objective:   Physical Exam  Constitutional: He appears well-developed and well-nourished.  Cardiovascular: Normal rate and regular rhythm.   Pulmonary/Chest: Effort normal and breath sounds normal. No respiratory distress. He has no wheezes. He has no rales.  Abdominal: Soft. There is no tenderness.  Genitourinary:  Patient has area of tenderness and fluctuance around the 12:00 position. No  external hemorrhoids. No anal fissure  Musculoskeletal: He exhibits no edema.          Assessment & Plan:  Perianal abscess. We explained the need for incision and drainage and patient consented.  Area with Betadine. Anesthesia 1% plain Xylocaine. Using #11 blade a linear incision made over area of fluctuance. Copious amount of purulence was expressed. Using hemostats expressed considerable amount of pus. Wound cavity packed with iodoform gauze. Outer dressing applied. Reassess tomorrow and consider packing removal then.  He does not have any recurrent fever.  We considered oral antibiotics, but explained I and D is the primary treatment.  Also, he is in middle of oral Typhoid vaccine-but did explain if symptoms worsened would need to look at oral antibiotics.

## 2012-06-15 NOTE — Telephone Encounter (Signed)
I called pt and asked him to use the vaseline gauze over the open abscess and use the pad as instructed this am.  Not too surprising the packing came out.  Pt is scheduled 4:30 tomorrow.  I explained all schedules full today, Dr Caryl Never no longer at the office this afternoon.  I left it with the pt that if Dr Caryl Never wanted him to come in earlier tomorrow, I would call him back.

## 2012-06-15 NOTE — Telephone Encounter (Signed)
No.  Wait until follow up tomorrow.

## 2012-06-15 NOTE — Telephone Encounter (Signed)
Patient Information:  Caller Name: Berlie  Phone: [phone]  Patient: [name], [name]  Gender: Male  DOB: 04-Apr-1960  Age: 52 Years  PCP: Evelena Peat [facility])  Office Follow Up:  Does the office need to follow up with this patient?: Yes  Instructions For The Office: Pleasea see reason for call.   Thank you.   Symptoms  Reason For Call & Symptoms: Packing fell out of wound.  Caller not sure if all of it came out.   Pt has a follow up appt 06/16/12.  Does pt need to come back in to have abscess repacked today?  Reviewed Health History In EMR: No  Reviewed Medications In EMR: No  Reviewed Allergies In EMR: No  Reviewed Surgeries / Procedures: No  Date of Onset of Symptoms: 06/15/2012  Guideline(s) Used:  No Protocol Available - Sick Adult  Disposition Per Guideline:   Discuss with PCP and Callback by Nurse within 1 Hour  Reason For Disposition Reached:   Nursing judgment  Advice Given:  Call Back If:  New symptoms develop  You become worse.  Patient Will Follow Care Advice:  YES

## 2012-06-16 ENCOUNTER — Ambulatory Visit (INDEPENDENT_AMBULATORY_CARE_PROVIDER_SITE_OTHER): Payer: 59 | Admitting: Family Medicine

## 2012-06-16 ENCOUNTER — Encounter: Payer: Self-pay | Admitting: Family Medicine

## 2012-06-16 VITALS — Temp 98.6°F

## 2012-06-16 DIAGNOSIS — K610 Anal abscess: Secondary | ICD-10-CM

## 2012-06-16 DIAGNOSIS — K612 Anorectal abscess: Secondary | ICD-10-CM

## 2012-06-16 NOTE — Patient Instructions (Addendum)
Warm bath soaks once or twice daily for next few days.

## 2012-06-16 NOTE — Progress Notes (Signed)
  Subjective:    Patient ID: [name], male    DOB: 05-07-1960, 52 y.o.   MRN: [id number]  HPI Followup fairly large perianal abscess which was drained yesterday Tremendously improved today. Still had low-grade fever yesterday but none today Pain is essentially resolved. Patient had a stool yesterday and packing came out with that He had absolutely no drainage today. No pain with sitting No prior history of perianal abscess  Past Medical History  Diagnosis Date  . Allergy     seasonal  . 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 CORNS AND CALLUSES 08/24/2008   Past Surgical History  Procedure Laterality Date  . Ankle fracture surgery      left ankle    reports that he quit smoking about 2 years ago. His smoking use included Cigarettes. He has a 35 pack-year smoking history. He has never used smokeless tobacco. He reports that he drinks about 3.6 ounces of alcohol per week. He reports that he does not use illicit drugs. family history includes Cancer in his other; Diabetes in his other; and Mental illness in his other. Allergies  Allergen Reactions  . Penicillins     REACTION: rash as a child      Review of Systems  Constitutional: Negative for fever and chills.  Gastrointestinal: Negative for nausea, vomiting, abdominal pain and diarrhea.       Objective:   Physical Exam  Constitutional: He appears well-developed and well-nourished.  Cardiovascular: Normal rate and regular rhythm.   Genitourinary:  Perianal region is examined. Wound examined. No drainage. No erythema. No fluctuance. Nontender. Packing is definitely out          Assessment & Plan:  Perianal abscess which was drained yesterday. Greatly improved. Start warm sitz baths twice daily for the next 4-5 days. Followup promptly for any recurrent swelling, pain, or fever

## 2012-06-21 ENCOUNTER — Telehealth: Payer: Self-pay | Admitting: Family Medicine

## 2012-06-21 NOTE — Telephone Encounter (Signed)
Patient Information:  Caller Name: Reise  Phone: [phone]  Patient: [name], [name]  Gender: Male  DOB: 03/19/1960  Age: 52 Years  PCP: Evelena Peat (Family Practice)  Office Follow Up:  Does the office need to follow up with this patient?: No  Instructions For The Office: N/A   Symptoms  Reason For Call & Symptoms: Patient reports he had rectal abscess drained by Dr. Caryl Never 5/27 and rechecked on 5/28, Continues to have drainage - "reddish yellow"; denies pain, fever or swelling.  Emergent symptoms ruled out.  Home care and parameters for callback given per  No Protocol Available - Sick Adult protocol. Caller voiced understanding.  Reviewed Health History In EMR: Yes  Reviewed Medications In EMR: Yes  Reviewed Allergies In EMR: Yes  Reviewed Surgeries / Procedures: Yes  Date of Onset of Symptoms: 06/15/2012  Treatments Tried: Sitz baths daily  Treatments Tried Worked: No  Guideline(s) Used:  No Protocol Available - Sick Adult  Disposition Per Guideline:   Home Care  Reason For Disposition Reached:   Patient's symptoms are safe to treat at home per nursing judgment  Advice Given:  Call Back If:  New symptoms develop  You become worse.  Patient Will Follow Care Advice:  YES

## 2012-07-05 ENCOUNTER — Telehealth: Payer: Self-pay | Admitting: Family Medicine

## 2012-07-05 NOTE — Telephone Encounter (Signed)
Pt requesting a call from Dr. Caryl Never regarding the abscess. He did not elaborate - said Dr. Caryl Never would understand. Thank you.

## 2012-07-05 NOTE — Telephone Encounter (Signed)
Spoke with patient. He has not had any recurrent pain but has some persistent drainage. We explained he could have a deeper fistula tract and have recommended he see general surgeon. He prefers to wait until after his upcoming trip to [place] to pursue this

## 2012-07-09 ENCOUNTER — Telehealth: Payer: Self-pay | Admitting: Family Medicine

## 2012-07-09 MED ORDER — HYDROCODONE-ACETAMINOPHEN 5-325 MG PO TABS: ORAL_TABLET | ORAL | Status: DC

## 2012-07-09 NOTE — Telephone Encounter (Signed)
Pt is traveling to [place] at [employer] today. Pt is concerned that he may have some reoccurring pain while he is gone w/ his previous perianal abscess. He was seen here 5/28 for that issue.  Pt states its still draining. Pt would like to take some kind of pain pill w/ him 'just in case" . Pt states percocet or hydrocodone, whatever. Pt would like to pick up the script today after lunch, since he has to be at airport by 3pm.

## 2012-07-09 NOTE — Telephone Encounter (Signed)
Pt informed Rx will be ready to pick-up.  Per verbal from Dr Caryl Never, hydrocodone 5-325, 1-2 tabs every 6 hours prn pain, #40 with 0 refills

## 2012-07-21 ENCOUNTER — Telehealth: Payer: Self-pay | Admitting: Family Medicine

## 2012-07-21 DIAGNOSIS — K610 Anal abscess: Secondary | ICD-10-CM

## 2012-07-21 NOTE — Telephone Encounter (Signed)
Set up to see general surgeon.  I will set up .  Let pt know.

## 2012-07-21 NOTE — Telephone Encounter (Signed)
Pt was in 5/27 for abcess. MD discussed if abcess not better he made need to see general surgeon. Pt asked does he need to see you first or go straight to referral. Pls advise.

## 2012-07-21 NOTE — Telephone Encounter (Signed)
Informed patient wife to let the patient know that he needs to see a Development worker, international aid.

## 2012-07-21 NOTE — Telephone Encounter (Signed)
Is it okay to send patient to referral so they can set patient up for appointment

## 2012-07-27 ENCOUNTER — Ambulatory Visit (INDEPENDENT_AMBULATORY_CARE_PROVIDER_SITE_OTHER): Payer: 59 | Admitting: General Surgery

## 2012-07-27 ENCOUNTER — Encounter (INDEPENDENT_AMBULATORY_CARE_PROVIDER_SITE_OTHER): Payer: Self-pay | Admitting: General Surgery

## 2012-07-27 VITALS — BP 126/68 | HR 64 | Temp 98.2°F | Resp 15 | Ht 73.0 in | Wt 236.4 lb

## 2012-07-27 DIAGNOSIS — K612 Anorectal abscess: Secondary | ICD-10-CM

## 2012-07-27 DIAGNOSIS — K611 Rectal abscess: Secondary | ICD-10-CM

## 2012-07-27 NOTE — Progress Notes (Signed)
Subjective:     Patient ID: [name], male   DOB: 06/06/1960, 52 y.o.   MRN: [id number]  HPI We are asked to see the patient in consultation by Dr. Burchett to evaluate him for a perirectal abscess. The patient is a 52-year-old white male who developed some perirectal pain back in late May. He went to see his medical doctor who diagnosed him with a perirectal abscess and incised and drained it. This helped his pain resolved. Unfortunately he has continued to have persistent drainage from the area since then. He denies any fevers or chills. His bowel movements are regular.  Review of Systems  Constitutional: Negative.   HENT: Negative.   Eyes: Negative.   Respiratory: Negative.   Cardiovascular: Negative.   Gastrointestinal: Negative.   Endocrine: Negative.   Genitourinary: Negative.   Musculoskeletal: Negative.   Skin: Negative.   Allergic/Immunologic: Negative.   Neurological: Negative.   Hematological: Negative.   Psychiatric/Behavioral: Negative.        Objective:   Physical Exam  Constitutional: He is oriented to person, place, and time. He appears well-developed and well-nourished.  HENT:  Head: Normocephalic and atraumatic.  Eyes: Conjunctivae and EOM are normal. Pupils are equal, round, and reactive to light.  Neck: Normal range of motion. Neck supple.  Cardiovascular: Normal rate, regular rhythm and normal heart sounds.   Pulmonary/Chest: Effort normal and breath sounds normal.  Abdominal: Bowel sounds are normal.  Genitourinary:  There is a small opening in the skin in the right posterior perirectal area. There is purulent drainage from the area. There is no cellulitis. He did not tolerate an attempt at probing the opening.  Musculoskeletal: Normal range of motion.  Neurological: He is alert and oriented to person, place, and time.  Skin: Skin is warm and dry.  Psychiatric: He has a normal mood and affect. His behavior is normal.       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 I think he would benefit from an exam under anesthesia and either fistulotomy versus seton placement. I have discussed with him in detail the risks and benefits of the operation to do this as well as some of the technical aspects including the risk of incontinence the sphincter muscles are compromised and he understands and wishes to proceed     Plan:     Plan for exam under anesthesia and either fistulotomy or placement of a seton       

## 2012-07-28 ENCOUNTER — Encounter (HOSPITAL_COMMUNITY): Payer: Self-pay | Admitting: Pharmacy Technician

## 2012-08-02 ENCOUNTER — Encounter (HOSPITAL_COMMUNITY): Payer: Self-pay

## 2012-08-02 ENCOUNTER — Telehealth (INDEPENDENT_AMBULATORY_CARE_PROVIDER_SITE_OTHER): Payer: Self-pay | Admitting: General Surgery

## 2012-08-02 ENCOUNTER — Encounter (HOSPITAL_COMMUNITY)
Admission: RE | Admit: 2012-08-02 | Discharge: 2012-08-02 | Disposition: A | Discharge status: Home / Self Care | Payer: 59 | Source: Ambulatory Visit | Attending: General Surgery | Admitting: General Surgery

## 2012-08-02 LAB — CBC
HCT: 43.1 % (ref 39.0–52.0)
MCH: 31.3 pg (ref 26.0–34.0)
MCV: 92.5 fL (ref 78.0–100.0)
Platelets: 220 10*3/uL (ref 150–400)
RDW: 14 % (ref 11.5–15.5)
WBC: 9.9 10*3/uL (ref 4.0–10.5)

## 2012-08-02 MED ORDER — CHLORHEXIDINE GLUCONATE 4 % EX LIQD: 1.0000 "application " | Freq: Once | CUTANEOUS | Status: DC

## 2012-08-02 MED ORDER — CIPROFLOXACIN IN D5W 400 MG/200ML IV SOLN
400.0000 mg | INTRAVENOUS | Status: AC
  Administered 2012-08-03: 400 mg via INTRAVENOUS
  Filled 2012-08-02: qty 200

## 2012-08-02 NOTE — Telephone Encounter (Signed)
Patient had his pre-op appt at the hospital today for surgery tomorrow and was told in his preop orders he was to use a fleets enema tonight. I verified per notes that he should use one fleets enema per rectum at least two hours prior to bed time. He will call with any additional questions.

## 2012-08-02 NOTE — Pre-Procedure Instructions (Signed)
[name]  08/02/2012   Your procedure is scheduled on:  Tuesday August 03, 2012  Report to [facility] at [phone] AM.  Call this number if you have problems the morning of surgery: [phone]   Remember:   Do not eat food or drink liquids after midnight.   Take these medicines the morning of surgery with A SIP OF WATER: Albuterol inhaler    Do not wear jewelry.  Do not wear lotions, powders, or perfumes. You may wear deodorant.             Men may shave face and neck.  Do not bring valuables to the hospital.  [facility] is not responsible for any belongings or valuables.  Contacts, dentures or bridgework may not be worn into surgery.  Leave suitcase in the car. After surgery it may be brought to your room.  For patients admitted to the hospital, checkout time is 11:00 AM the day of  discharge.   Patients discharged the day of surgery will not be allowed to drive home.  Name and phone number of your driver: Wife- [name]  Special Instructions: Shower using CHG 2 nights before surgery and the night before surgery.  If you shower the day of surgery use CHG.  Use special wash - you have one bottle of CHG for all showers.  You should use approximately 1/3 of the bottle for each shower.   Please read over the following fact sheets that you were given: Pain Booklet, Coughing and Deep Breathing, MRSA Information and Surgical Site Infection Prevention

## 2012-08-03 ENCOUNTER — Ambulatory Visit (HOSPITAL_COMMUNITY)
Admission: RE | Admit: 2012-08-03 | Discharge: 2012-08-03 | Disposition: A | Discharge status: Home / Self Care | Payer: 59 | Source: Ambulatory Visit | Attending: General Surgery | Admitting: General Surgery

## 2012-08-03 ENCOUNTER — Ambulatory Visit (HOSPITAL_COMMUNITY): Payer: 59 | Admitting: Anesthesiology

## 2012-08-03 ENCOUNTER — Encounter (HOSPITAL_COMMUNITY)
Admission: RE | Disposition: A | Discharge status: Home / Self Care | Payer: Self-pay | Source: Ambulatory Visit | Attending: General Surgery

## 2012-08-03 ENCOUNTER — Encounter (HOSPITAL_COMMUNITY): Payer: Self-pay | Admitting: *Deleted

## 2012-08-03 ENCOUNTER — Encounter (HOSPITAL_COMMUNITY): Payer: Self-pay | Admitting: Anesthesiology

## 2012-08-03 DIAGNOSIS — K611 Rectal abscess: Secondary | ICD-10-CM

## 2012-08-03 DIAGNOSIS — K603 Anal fistula, unspecified: Secondary | ICD-10-CM | POA: Insufficient documentation

## 2012-08-03 HISTORY — PX: EXAMINATION UNDER ANESTHESIA: SHX1540

## 2012-08-03 SURGERY — EXAM UNDER ANESTHESIA
Anesthesia: General | Site: Rectum | Wound class: Contaminated

## 2012-08-03 MED ORDER — BUPIVACAINE-EPINEPHRINE 0.25% -1:200000 IJ SOLN
INTRAMUSCULAR | Status: DC | PRN
  Administered 2012-08-03: 30 mL

## 2012-08-03 MED ORDER — HYDROMORPHONE HCL PF 1 MG/ML IJ SOLN
INTRAMUSCULAR | Status: AC
  Administered 2012-08-03: 0.25 mg via INTRAVENOUS
  Filled 2012-08-03: qty 1

## 2012-08-03 MED ORDER — MIDAZOLAM HCL 5 MG/5ML IJ SOLN
INTRAMUSCULAR | Status: DC | PRN
  Administered 2012-08-03: 2 mg via INTRAVENOUS

## 2012-08-03 MED ORDER — HYALURONIDASE HUMAN 150 UNIT/ML IJ SOLN
INTRAMUSCULAR | Status: AC
  Filled 2012-08-03: qty 1

## 2012-08-03 MED ORDER — OXYCODONE HCL 5 MG PO TABS: 5.0000 mg | ORAL_TABLET | Freq: Once | ORAL | Status: DC | PRN

## 2012-08-03 MED ORDER — OXYCODONE HCL 5 MG/5ML PO SOLN: 5.0000 mg | Freq: Once | ORAL | Status: DC | PRN

## 2012-08-03 MED ORDER — DIBUCAINE 1 % RE OINT
TOPICAL_OINTMENT | RECTAL | Status: AC
  Filled 2012-08-03: qty 28

## 2012-08-03 MED ORDER — LACTATED RINGERS IV SOLN
INTRAVENOUS | Status: DC | PRN
  Administered 2012-08-03: 10:00:00 via INTRAVENOUS

## 2012-08-03 MED ORDER — 0.9 % SODIUM CHLORIDE (POUR BTL) OPTIME
TOPICAL | Status: DC | PRN
  Administered 2012-08-03: 1000 mL

## 2012-08-03 MED ORDER — HYDROCODONE-ACETAMINOPHEN 5-325 MG PO TABS: 1.0000 | ORAL_TABLET | ORAL | Status: DC | PRN

## 2012-08-03 MED ORDER — PROPOFOL 10 MG/ML IV BOLUS
INTRAVENOUS | Status: DC | PRN
  Administered 2012-08-03: 200 mg via INTRAVENOUS

## 2012-08-03 MED ORDER — HYDROMORPHONE HCL PF 1 MG/ML IJ SOLN
0.2500 mg | INTRAMUSCULAR | Status: DC | PRN
  Administered 2012-08-03: 0.5 mg via INTRAVENOUS
  Administered 2012-08-03: 0.25 mg via INTRAVENOUS

## 2012-08-03 MED ORDER — LIDOCAINE HCL (CARDIAC) 20 MG/ML IV SOLN
INTRAVENOUS | Status: DC | PRN
  Administered 2012-08-03: 100 mg via INTRAVENOUS

## 2012-08-03 MED ORDER — FENTANYL CITRATE 0.05 MG/ML IJ SOLN
INTRAMUSCULAR | Status: DC | PRN
  Administered 2012-08-03: 50 ug via INTRAVENOUS
  Administered 2012-08-03: 100 ug via INTRAVENOUS

## 2012-08-03 MED ORDER — BUPIVACAINE-EPINEPHRINE PF 0.25-1:200000 % IJ SOLN
INTRAMUSCULAR | Status: AC
  Filled 2012-08-03: qty 30

## 2012-08-03 MED ORDER — METOCLOPRAMIDE HCL 5 MG/ML IJ SOLN: 10.0000 mg | Freq: Once | INTRAMUSCULAR | Status: DC | PRN

## 2012-08-03 MED ORDER — LACTATED RINGERS IV SOLN
INTRAVENOUS | Status: DC
  Administered 2012-08-03: 09:00:00 via INTRAVENOUS

## 2012-08-03 SURGICAL SUPPLY — 43 items
BLADE SURG 15 STRL LF DISP TIS (BLADE) ×2 IMPLANT
BLADE SURG 15 STRL SS (BLADE) ×1
CANISTER SUCTION 2500CC (MISCELLANEOUS) ×3 IMPLANT
CLOTH BEACON ORANGE TIMEOUT ST (SAFETY) ×3 IMPLANT
COVER SURGICAL LIGHT HANDLE (MISCELLANEOUS) ×3 IMPLANT
DECANTER SPIKE VIAL GLASS SM (MISCELLANEOUS) ×3 IMPLANT
DRAPE PROXIMA HALF (DRAPES) ×3 IMPLANT
DRAPE UTILITY 15X26 W/TAPE STR (DRAPE) ×6 IMPLANT
DRSG PAD ABDOMINAL 8X10 ST (GAUZE/BANDAGES/DRESSINGS) ×3 IMPLANT
ELECT CAUTERY BLADE 6.4 (BLADE) ×3 IMPLANT
ELECT REM PT RETURN 9FT ADLT (ELECTROSURGICAL) ×3
ELECTRODE REM PT RTRN 9FT ADLT (ELECTROSURGICAL) ×2 IMPLANT
GAUZE SPONGE 4X4 16PLY XRAY LF (GAUZE/BANDAGES/DRESSINGS) ×3 IMPLANT
GLOVE BIO SURGEON STRL SZ7.5 (GLOVE) ×3 IMPLANT
GLOVE BIOGEL PI IND STRL 6 (GLOVE) ×2 IMPLANT
GLOVE BIOGEL PI IND STRL 7.0 (GLOVE) ×2 IMPLANT
GLOVE BIOGEL PI INDICATOR 6 (GLOVE) ×1
GLOVE BIOGEL PI INDICATOR 7.0 (GLOVE) ×1
GLOVE SKINSENSE NS SZ7.0 (GLOVE) ×1
GLOVE SKINSENSE STRL SZ7.0 (GLOVE) ×2 IMPLANT
GOWN STRL NON-REIN LRG LVL3 (GOWN DISPOSABLE) ×6 IMPLANT
KIT BASIN OR (CUSTOM PROCEDURE TRAY) ×3 IMPLANT
KIT ROOM TURNOVER OR (KITS) ×3 IMPLANT
LOOP VESSEL MAXI BLUE (MISCELLANEOUS) ×3 IMPLANT
NEEDLE HYPO 25GX1X1/2 BEV (NEEDLE) ×3 IMPLANT
NS IRRIG 1000ML POUR BTL (IV SOLUTION) ×3 IMPLANT
PACK LITHOTOMY IV (CUSTOM PROCEDURE TRAY) ×3 IMPLANT
PAD ARMBOARD 7.5X6 YLW CONV (MISCELLANEOUS) ×6 IMPLANT
PENCIL BUTTON HOLSTER BLD 10FT (ELECTRODE) ×3 IMPLANT
SPONGE GAUZE 4X4 12PLY (GAUZE/BANDAGES/DRESSINGS) ×3 IMPLANT
SPONGE SURGIFOAM ABS GEL 12-7 (HEMOSTASIS) ×3 IMPLANT
SURGILUBE 2OZ TUBE FLIPTOP (MISCELLANEOUS) ×3 IMPLANT
SUT CHROMIC 2 0 SH (SUTURE) IMPLANT
SUT SILK 0 TIES 10X30 (SUTURE) ×3 IMPLANT
SYR BULB 3OZ (MISCELLANEOUS) ×3 IMPLANT
SYR CONTROL 10ML LL (SYRINGE) ×3 IMPLANT
TAPE CLOTH SURG 4X10 WHT LF (GAUZE/BANDAGES/DRESSINGS) ×3 IMPLANT
TOWEL OR 17X24 6PK STRL BLUE (TOWEL DISPOSABLE) ×3 IMPLANT
TOWEL OR 17X26 10 PK STRL BLUE (TOWEL DISPOSABLE) ×3 IMPLANT
TRAY PROCTOSCOPIC FIBER OPTIC (SET/KITS/TRAYS/PACK) IMPLANT
TUBE CONNECTING 12X1/4 (SUCTIONS) ×3 IMPLANT
UNDERPAD 30X30 INCONTINENT (UNDERPADS AND DIAPERS) ×3 IMPLANT
YANKAUER SUCT BULB TIP NO VENT (SUCTIONS) ×3 IMPLANT

## 2012-08-03 NOTE — Interval H&P Note (Signed)
History and Physical Interval Note:  08/03/2012 8:49 AM  [name]  has presented today for surgery, with the diagnosis of anal fistula  The various methods of treatment have been discussed with the patient and family. After consideration of risks, benefits and other options for treatment, the patient has consented to  Procedure(s): EXAM UNDER ANESTHESIA (N/A) FISTULOTOMY VS SETON (N/A) PLACEMENT OF SETON VS FISTULOMY (N/A) as a surgical intervention .  The patient's history has been reviewed, patient examined, no change in status, stable for surgery.  I have reviewed the patient's chart and labs.  Questions were answered to the patient's satisfaction.     TOTH III,Zniyah Midkiff S

## 2012-08-03 NOTE — Anesthesia Postprocedure Evaluation (Signed)
Anesthesia Post Note  Patient: [name]  Procedure(s) Performed: Procedure(s) (LRB): EXAM UNDER ANESTHESIA (N/A)  Anesthesia type: General  Patient location: PACU  Post pain: Pain level controlled  Post assessment: Patient's Cardiovascular Status Stable  Last Vitals:  Filed Vitals:   08/03/12 1200  BP: 106/62  Pulse: 53  Temp:   Resp: 19    Post vital signs: Reviewed and stable  Level of consciousness: alert  Complications: No apparent anesthesia complications

## 2012-08-03 NOTE — Anesthesia Procedure Notes (Addendum)
Procedure Name: LMA Insertion Date/Time: 08/03/2012 10:25 AM Performed by: Orvilla Fus A Pre-anesthesia Checklist: Patient identified, Timeout performed, Emergency Drugs available, Suction available and Patient being monitored Patient Re-evaluated:Patient Re-evaluated prior to inductionOxygen Delivery Method: Circle system utilized Preoxygenation: Pre-oxygenation with 100% oxygen Intubation Type: IV induction Ventilation: Mask ventilation without difficulty LMA: LMA inserted LMA Size: 5.0 Number of attempts: 1 Placement Confirmation: breath sounds checked- equal and bilateral and positive ETCO2 Tube secured with: Tape Dental Injury: Teeth and Oropharynx as per pre-operative assessment

## 2012-08-03 NOTE — H&P (View-Only) (Signed)
Subjective:     Patient ID: [name], male   DOB: Jun 02, 1960, 52 y.o.   MRN: [id number]  HPI We are asked to see the patient in consultation by Dr. Sander Radon to evaluate him for a perirectal abscess. The patient is a 52 year old white male who developed some perirectal pain back in late May. He went to see his medical doctor who diagnosed him with a perirectal abscess and incised and drained it. This helped his pain resolved. Unfortunately he has continued to have persistent drainage from the area since then. He denies any fevers or chills. His bowel movements are regular.  Review of Systems  Constitutional: Negative.   HENT: Negative.   Eyes: Negative.   Respiratory: Negative.   Cardiovascular: Negative.   Gastrointestinal: Negative.   Endocrine: Negative.   Genitourinary: Negative.   Musculoskeletal: Negative.   Skin: Negative.   Allergic/Immunologic: Negative.   Neurological: Negative.   Hematological: Negative.   Psychiatric/Behavioral: Negative.        Objective:   Physical Exam  Constitutional: He is oriented to person, place, and time. He appears well-developed and well-nourished.  HENT:  Head: Normocephalic and atraumatic.  Eyes: Conjunctivae and EOM are normal. Pupils are equal, round, and reactive to light.  Neck: Normal range of motion. Neck supple.  Cardiovascular: Normal rate, regular rhythm and normal heart sounds.   Pulmonary/Chest: Effort normal and breath sounds normal.  Abdominal: Bowel sounds are normal.  Genitourinary:  There is a small opening in the skin in the right posterior perirectal area. There is purulent drainage from the area. There is no cellulitis. He did not tolerate an attempt at probing the opening.  Musculoskeletal: Normal range of motion.  Neurological: He is alert and oriented to person, place, and time.  Skin: Skin is warm and dry.  Psychiatric: He has a normal mood and affect. His behavior is normal.       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 I think he would benefit from an exam under anesthesia and either fistulotomy versus seton placement. I have discussed with him in detail the risks and benefits of the operation to do this as well as some of the technical aspects including the risk of incontinence the sphincter muscles are compromised and he understands and wishes to proceed     Plan:     Plan for exam under anesthesia and either fistulotomy or placement of a seton

## 2012-08-03 NOTE — Transfer of Care (Signed)
Immediate Anesthesia Transfer of Care Note  Patient: [name]  Procedure(s) Performed: Procedure(s) with comments: EXAM UNDER ANESTHESIA (N/A) - placement of seton drain  Patient Location: PACU  Anesthesia Type:General  Level of Consciousness: awake, alert  and oriented  Airway & Oxygen Therapy: Patient Spontanous Breathing and Patient connected to nasal cannula oxygen  Post-op Assessment: Report given to PACU RN, Post -op Vital signs reviewed and stable and Patient moving all extremities  Post vital signs: Reviewed and stable  Complications: No apparent anesthesia complications

## 2012-08-03 NOTE — Anesthesia Preprocedure Evaluation (Signed)
Anesthesia Evaluation  Patient identified by MRN, date of birth, ID band Patient awake    Reviewed: Allergy & Precautions, H&P , NPO status , Patient's Chart, lab work & pertinent test results, reviewed documented beta blocker date and time   Airway Mallampati: II TM Distance: >3 FB Neck ROM: full    Dental   Pulmonary asthma , former smoker,  breath sounds clear to auscultation        Cardiovascular negative cardio ROS  Rhythm:regular     Neuro/Psych negative neurological ROS  negative psych ROS   GI/Hepatic negative GI ROS, Neg liver ROS,   Endo/Other  negative endocrine ROS  Renal/GU negative Renal ROS  negative genitourinary   Musculoskeletal   Abdominal   Peds  Hematology negative hematology ROS (+)   Anesthesia Other Findings See surgeon's H&P   Reproductive/Obstetrics negative OB ROS                           Anesthesia Physical Anesthesia Plan  ASA: II  Anesthesia Plan: General   Post-op Pain Management:    Induction: Intravenous  Airway Management Planned: LMA  Additional Equipment:   Intra-op Plan:   Post-operative Plan: Extubation in OR  Informed Consent: I have reviewed the patients History and Physical, chart, labs and discussed the procedure including the risks, benefits and alternatives for the proposed anesthesia with the patient or authorized representative who has indicated his/her understanding and acceptance.   Dental Advisory Given  Plan Discussed with: CRNA and Surgeon  Anesthesia Plan Comments:         Anesthesia Quick Evaluation

## 2012-08-03 NOTE — Op Note (Addendum)
08/03/2012  10:50 AM  PATIENT:  [name]  52 y.o. male  PRE-OPERATIVE DIAGNOSIS:  anal fistula  POST-OPERATIVE DIAGNOSIS:  anal fistula  PROCEDURE:  Procedure(s): EXAM UNDER ANESTHESIA (N/A) PLACEMENT OF SETON and debridement of fistula tract  SURGEON:  Surgeon(s) and Role:    * Robyne Askew, MD - Primary  PHYSICIAN ASSISTANT:   ASSISTANTS: none   ANESTHESIA:   general  EBL:     BLOOD ADMINISTERED:none  DRAINS: none   LOCAL MEDICATIONS USED:  MARCAINE     SPECIMEN:  No Specimen  DISPOSITION OF SPECIMEN:  N/A  COUNTS:  YES  TOURNIQUET:  * No tourniquets in log *  DICTATION: .Dragon Dictation After informed consent was obtained the patient was brought to the operating room and placed in the supine position on the operating room table. After adequate induction of general anesthesia the patient was moved in the lithotomy position and all pressure points were padded. The perirectal area was then prepped with Betadine and draped in usual sterile manner. The perirectal region was then infiltrated with quarter percent Marcaine with epinephrine. A bullet retractor was then placed inside the rectum and the rectum was examined circumferentially. No significant abnormalities were noted on initial inspection. Posteriorly the opening at the skin where the abscess was drained it was probed with a small silver probe. The probe was able to easily demonstrate the fistula tract to the posterior midline. The granulation tissue along the tract was destroyed with a small curet. Some of the granulation tissue along the tract was also fulgurated with the cautery. The probe was then used to bring a blue Vesseloop across the tract. Tension was held on the tails of the vessel loop and a 0 silk tie was placed near the base of the vessel loop to apply constant traction to the tract. A small piece of gauze was then packed into the tract. Dressings were then applied. The patient tolerated the procedure  well. At the end of the case all needle sponge and instrument counts were correct. The patient was then awakened and taken to recovery in stable condition.  PLAN OF CARE: Discharge to home after PACU  PATIENT DISPOSITION:  PACU - hemodynamically stable.   Delay start of Pharmacological VTE agent (>24hrs) due to surgical blood loss or risk of bleeding: not applicable

## 2012-08-05 ENCOUNTER — Encounter (HOSPITAL_COMMUNITY): Payer: Self-pay | Admitting: General Surgery

## 2012-08-17 ENCOUNTER — Encounter (INDEPENDENT_AMBULATORY_CARE_PROVIDER_SITE_OTHER): Payer: Self-pay | Admitting: General Surgery

## 2012-08-17 ENCOUNTER — Ambulatory Visit (INDEPENDENT_AMBULATORY_CARE_PROVIDER_SITE_OTHER): Payer: 59 | Admitting: General Surgery

## 2012-08-17 VITALS — BP 112/58 | HR 62 | Resp 16 | Ht 73.0 in | Wt 232.2 lb

## 2012-08-17 DIAGNOSIS — K603 Anal fistula: Secondary | ICD-10-CM

## 2012-08-17 NOTE — Progress Notes (Signed)
Subjective:     Patient ID: [name], male   DOB: Dec 31, 1960, 52 y.o.   MRN: [id number]  HPI The patient is a 52 year old white male who is about 2 weeks status post placement of a seton for an anal fistula. His discomfort is gradually resolving. He's had minimal discharge from the area. His appetite is good and his bowels are working normally.  Review of Systems     Objective:   Physical Exam On exam the seton was in place and still fairly snug. The wound looks clean.    Assessment:     The patient is 2 weeks status post placement of a seton for an anal fistula     Plan:     At this point I would like him to continue to keep the perirectal area clean and dry. I will plan to see him back in one month to check his progress and hopefully tightened the seton

## 2012-08-17 NOTE — Patient Instructions (Signed)
Keep rectal area clean and dry

## 2012-09-07 ENCOUNTER — Ambulatory Visit (INDEPENDENT_AMBULATORY_CARE_PROVIDER_SITE_OTHER): Payer: 59 | Admitting: Family Medicine

## 2012-09-07 DIAGNOSIS — Z23 Encounter for immunization: Secondary | ICD-10-CM

## 2012-09-14 ENCOUNTER — Encounter (INDEPENDENT_AMBULATORY_CARE_PROVIDER_SITE_OTHER): Payer: 59 | Admitting: General Surgery

## 2012-09-16 ENCOUNTER — Ambulatory Visit (INDEPENDENT_AMBULATORY_CARE_PROVIDER_SITE_OTHER): Payer: 59 | Admitting: General Surgery

## 2012-09-16 ENCOUNTER — Encounter (INDEPENDENT_AMBULATORY_CARE_PROVIDER_SITE_OTHER): Payer: Self-pay | Admitting: General Surgery

## 2012-09-16 VITALS — BP 110/70 | HR 68 | Resp 18 | Ht 73.5 in | Wt 236.0 lb

## 2012-09-16 DIAGNOSIS — K603 Anal fistula, unspecified: Secondary | ICD-10-CM

## 2012-09-16 NOTE — Patient Instructions (Signed)
Keep area clean and dry. °

## 2012-10-14 ENCOUNTER — Ambulatory Visit (INDEPENDENT_AMBULATORY_CARE_PROVIDER_SITE_OTHER): Payer: 59 | Admitting: General Surgery

## 2012-10-14 ENCOUNTER — Encounter (INDEPENDENT_AMBULATORY_CARE_PROVIDER_SITE_OTHER): Payer: Self-pay | Admitting: General Surgery

## 2012-10-14 VITALS — BP 124/70 | HR 64 | Temp 97.2°F | Resp 14 | Ht 73.5 in | Wt 238.6 lb

## 2012-10-14 DIAGNOSIS — K603 Anal fistula: Secondary | ICD-10-CM

## 2012-10-14 NOTE — Patient Instructions (Signed)
Continue to keep area clean and dry. 

## 2012-10-14 NOTE — Progress Notes (Signed)
Subjective:     Patient ID: [name], male   DOB: Oct 25, 1960, 52 y.o.   MRN: [id number]  HPI The patient is a 52 year old white male who is one month status post placement of a seton for an anal fistula. He denies any rectal pain. His appetite has been good and his bowels are working normally. He has had minimal drainage from the area.  Review of Systems     Objective:   Physical Exam On exam the rectal area looks clean. A rubber band is a little bit loose and we were able to tighten it up with a 0 silk tie. He tolerated this well    Assessment:     The patient is one month status post placement of a seton for an anal fistula     Plan:     At this point he will continue to keep the area clean and dry. We will plan to see him back in one month to check his progress

## 2012-10-14 NOTE — Progress Notes (Signed)
Subjective:     Patient ID: [name], male   DOB: 27-Jul-1960, 52 y.o.   MRN: [id number]  HPI The patient is a 52 year old white male who is 2 months status post placement of a seton for an anal fistula. He denies any rectal pain. The rubber band is still in place. His appetite has been good and his bowels are working normally.  Review of Systems     Objective:   Physical Exam On exam his perirectal scan looks good. The seton is still in place.    Assessment:     The patient is 2 months status post placement of a seton for an anal fistula     Plan:     At this point he will continue to keep his perirectal area clean and dry. We will plan to see him back in one month at which time if The rubber band is looser we will try to tighten it up again.

## 2012-10-21 ENCOUNTER — Encounter: Payer: Self-pay | Admitting: Family Medicine

## 2012-10-21 ENCOUNTER — Ambulatory Visit (INDEPENDENT_AMBULATORY_CARE_PROVIDER_SITE_OTHER): Payer: 59 | Admitting: Family Medicine

## 2012-10-21 VITALS — BP 110/64 | HR 68 | Temp 97.9°F | Wt 242.0 lb

## 2012-10-21 DIAGNOSIS — N508 Other specified disorders of male genital organs: Secondary | ICD-10-CM

## 2012-10-21 DIAGNOSIS — Z23 Encounter for immunization: Secondary | ICD-10-CM

## 2012-10-21 DIAGNOSIS — N5089 Other specified disorders of the male genital organs: Secondary | ICD-10-CM

## 2012-10-21 NOTE — Progress Notes (Signed)
  Subjective:    Patient ID: [name], male    DOB: 20-Oct-1960, 52 y.o.   MRN: [id number]  HPI Patient seen with scrotal mass. Noted right-sided scrotal. Noted incidentally a few weeks ago. No pain. No dysuria. No lymphadenopathy. No prior history of mastectomy. No history of hernia. No associated symptoms  Past Medical History  Diagnosis Date  . Allergy     seasonal  . 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 CORNS AND CALLUSES 08/24/2008   Past Surgical History  Procedure Laterality Date  . Ankle fracture surgery      left ankle  . Eye surgery Bilateral     Lasik  . Examination under anesthesia N/A 08/03/2012    Procedure: placement of seton drain;  Surgeon: Robyne Askew, MD;  Location: [facility] OR;  Service: General;  Laterality: N/A;  placement of seton drain    reports that he quit smoking about 2 years ago. His smoking use included Cigarettes. He has a 35 pack-year smoking history. He has never used smokeless tobacco. He reports that he drinks about 3.6 ounces of alcohol per week. He reports that he does not use illicit drugs. family history includes Cancer in his other and sister; Diabetes in his other; Mental illness in his other. Allergies  Allergen Reactions  . Penicillins     REACTION: rash as a child      Review of Systems  Constitutional: Negative for fever and chills.  Genitourinary: Negative for dysuria.       Objective:   Physical Exam  Constitutional: He appears well-developed and well-nourished.  Cardiovascular: Normal rate and regular rhythm.   Pulmonary/Chest: Effort normal and breath sounds normal. No respiratory distress. He has no wheezes. He has no rales.  Genitourinary:  Patient has a right-sided scrotal mass which is approximately 1 and1/2-2 cm diameter and is well demarcated and separates from the right testicle. This appears to be a fluid-filled mass and is nontender.          Assessment & Plan:  Right scrotal mass. Question spermatocele versus other  benign mass. Check ultrasound to further assess

## 2012-10-26 ENCOUNTER — Ambulatory Visit
Admission: RE | Admit: 2012-10-26 | Discharge: 2012-10-26 | Disposition: A | Discharge status: Home / Self Care | Payer: 59 | Source: Ambulatory Visit | Attending: Family Medicine | Admitting: Family Medicine

## 2012-10-26 DIAGNOSIS — N5089 Other specified disorders of the male genital organs: Secondary | ICD-10-CM

## 2012-11-16 ENCOUNTER — Ambulatory Visit (INDEPENDENT_AMBULATORY_CARE_PROVIDER_SITE_OTHER): Payer: 59 | Admitting: General Surgery

## 2012-11-16 ENCOUNTER — Encounter (INDEPENDENT_AMBULATORY_CARE_PROVIDER_SITE_OTHER): Payer: Self-pay | Admitting: General Surgery

## 2012-11-16 ENCOUNTER — Encounter (INDEPENDENT_AMBULATORY_CARE_PROVIDER_SITE_OTHER): Payer: Self-pay

## 2012-11-16 VITALS — BP 118/82 | HR 68 | Temp 97.9°F | Resp 15 | Ht 73.5 in | Wt 238.0 lb

## 2012-11-16 DIAGNOSIS — K603 Anal fistula: Secondary | ICD-10-CM

## 2012-11-16 NOTE — Patient Instructions (Signed)
Will refer to Dr. Maisie Fus for possible advancement flap

## 2012-11-23 NOTE — Progress Notes (Signed)
Subjective:     Patient ID: [name], male   DOB: Jan 15, 1961, 52 y.o.   MRN: [id number]  HPI The patient is a 52 year old white male who is 3 months status post placement of a seton for an anal fistula. He has tolerated this well. The only report some occasional discomfort. He's had some small amount of drainage from the area. Otherwise his appetite is good and his bowels are working normally.  Review of Systems     Objective:   Physical Exam On exam his perirectal skin looks good. The open portion of the wound is clean. The seton was in place    Assessment:     The patient is 3 months status post placement of a seton for an anal fistula     Plan:     At this point I would like to refer him to Dr. Maisie Fus to see if he would be a candidate for an advancement flap and removal of the seton. If he is not Then I will plan to see him back in one month to check his progress

## 2012-12-02 ENCOUNTER — Other Ambulatory Visit (INDEPENDENT_AMBULATORY_CARE_PROVIDER_SITE_OTHER): Payer: 59

## 2012-12-02 DIAGNOSIS — Z0000 Encounter for general adult medical examination without abnormal findings: Secondary | ICD-10-CM

## 2012-12-02 LAB — POCT URINALYSIS DIPSTICK
Bilirubin, UA: NEGATIVE
Ketones, UA: NEGATIVE
Protein, UA: NEGATIVE
Spec Grav, UA: 1.02

## 2012-12-02 LAB — CBC WITH DIFFERENTIAL/PLATELET
Basophils Relative: 0.7 % (ref 0.0–3.0)
Hemoglobin: 15.4 g/dL (ref 13.0–17.0)
Lymphocytes Relative: 25.9 % (ref 12.0–46.0)
Monocytes Relative: 6.1 % (ref 3.0–12.0)
Neutro Abs: 6.1 10*3/uL (ref 1.4–7.7)
RBC: 4.91 Mil/uL (ref 4.22–5.81)
WBC: 9.4 10*3/uL (ref 4.5–10.5)

## 2012-12-02 LAB — HEPATIC FUNCTION PANEL
ALT: 22 U/L (ref 0–53)
AST: 20 U/L (ref 0–37)
Albumin: 3.9 g/dL (ref 3.5–5.2)
Alkaline Phosphatase: 51 U/L (ref 39–117)
Bilirubin, Direct: 0.1 mg/dL (ref 0.0–0.3)
Total Bilirubin: 0.7 mg/dL (ref 0.3–1.2)
Total Protein: 6.9 g/dL (ref 6.0–8.3)

## 2012-12-02 LAB — BASIC METABOLIC PANEL WITH GFR
BUN: 17 mg/dL (ref 6–23)
CO2: 28 meq/L (ref 19–32)
Calcium: 9.2 mg/dL (ref 8.4–10.5)
Chloride: 104 meq/L (ref 96–112)
Creatinine, Ser: 0.9 mg/dL (ref 0.4–1.5)
GFR: 91.55 mL/min
Glucose, Bld: 112 mg/dL — ABNORMAL HIGH (ref 70–99)
Potassium: 5 meq/L (ref 3.5–5.1)
Sodium: 138 meq/L (ref 135–145)

## 2012-12-02 LAB — LIPID PANEL
Cholesterol: 193 mg/dL (ref 0–200)
HDL: 43.9 mg/dL
LDL Cholesterol: 134 mg/dL — ABNORMAL HIGH (ref 0–99)
Total CHOL/HDL Ratio: 4
Triglycerides: 74 mg/dL (ref 0.0–149.0)
VLDL: 14.8 mg/dL (ref 0.0–40.0)

## 2012-12-07 ENCOUNTER — Encounter (INDEPENDENT_AMBULATORY_CARE_PROVIDER_SITE_OTHER): Payer: Self-pay | Admitting: General Surgery

## 2012-12-07 ENCOUNTER — Ambulatory Visit (INDEPENDENT_AMBULATORY_CARE_PROVIDER_SITE_OTHER): Payer: 59 | Admitting: General Surgery

## 2012-12-07 ENCOUNTER — Encounter (HOSPITAL_BASED_OUTPATIENT_CLINIC_OR_DEPARTMENT_OTHER): Payer: Self-pay | Admitting: *Deleted

## 2012-12-07 VITALS — BP 130/70 | HR 70 | Temp 97.1°F | Resp 14 | Ht 73.0 in | Wt 241.6 lb

## 2012-12-07 DIAGNOSIS — K603 Anal fistula, unspecified: Secondary | ICD-10-CM

## 2012-12-07 NOTE — Patient Instructions (Signed)

## 2012-12-07 NOTE — Progress Notes (Signed)
Chief Complaint  Patient presents with  . New Evaluation    eval for advancement flap    HISTORY: [name] is a 52 y.o. male who presents to the office with an anorectal fistula.  Other symptoms include irritation and occasional drainage.  He is s/p I&D by Dr Carolynne Edouard and developed a perirectal fistula.  A seton was placed 3 months ago. His bowel habits are regular and his bowel movements are soft.  His fiber intake is dietary.  His last colonoscopy was in 2012 and is due again next year due to multiple polyps.  He denies rectal bleeding.   He denies any chronic diarrhea or difficulty with fecal incontinence.  Past Medical History  Diagnosis Date  . Allergy     seasonal  . 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 CORNS AND CALLUSES 08/24/2008      Past Surgical History  Procedure Laterality Date  . Ankle fracture surgery      left ankle  . Eye surgery Bilateral     Lasik  . Examination under anesthesia N/A 08/03/2012    Procedure: placement of seton drain;  Surgeon: Robyne Askew, MD;  Location: [facility] OR;  Service: General;  Laterality: N/A;  placement of seton drain        Current Outpatient Prescriptions  Medication Sig Dispense Refill  . co-enzyme Q-10 50 MG capsule Take 50 mg by mouth daily.      Marland Kitchen glucosamine-chondroitin 500-400 MG tablet Take 1 tablet by mouth 2 (two) times daily.       Marland Kitchen ibuprofen (ADVIL,MOTRIN) 200 MG tablet Take 400 mg by mouth every 6 (six) hours as needed for pain.      . Red Yeast Rice 600 MG CAPS Take 600 mg by mouth 2 (two) times daily.       Marland Kitchen zolpidem (AMBIEN) 10 MG tablet Take 5 mg by mouth at bedtime as needed for sleep.       No current facility-administered medications for this visit.      Allergies  Allergen Reactions  . Penicillins     REACTION: rash as a child      Family History  Problem Relation Age of Onset  . Diabetes Other   . Mental illness Other   . Cancer Other     breast, prostate  . Cancer Sister     History   Social History  .  Marital Status: Married    Spouse Name: N/A    Number of Children: N/A  . Years of Education: N/A   Social History Main Topics  . Smoking status: Former Smoker -- 1.00 packs/day for 35 years    Types: Cigarettes    Quit date: 12/18/2009  . Smokeless tobacco: Never Used  . Alcohol Use: 3.6 oz/week    6 Cans of beer per week  . Drug Use: No  . Sexual Activity: None   Other Topics Concern  . None   Social History Narrative  . None      REVIEW OF SYSTEMS - PERTINENT POSITIVES ONLY: Review of Systems - General ROS: negative for - chills, fever or weight loss Hematological and Lymphatic ROS: negative for - bleeding problems, blood clots or bruising Respiratory ROS: no cough, shortness of breath, or wheezing Cardiovascular ROS: no chest pain or dyspnea on exertion Gastrointestinal ROS: no abdominal pain, change in bowel habits, or black or bloody stools Genito-Urinary ROS: no dysuria, trouble voiding, or hematuria  EXAM: Filed Vitals:   12/07/12 1004  BP: 130/70  Pulse: 70  Temp: 97.1 F (36.2 C)  Resp: 14    General appearance: alert and cooperative Resp: clear to auscultation bilaterally Cardio: regular rate and rhythm GI: normal findings: soft, non-tender  Perianal Findings: seton in place, appears to be mainly involving internal sphincter    ASSESSMENT AND PLAN:  [name] is a 52 year old man who is status post seton placement for a perianal fistula approximately 3 months ago. He presents today for evaluation from Dr. Carolynne Edouard.  On exam it appears that his seton appears to involve mostly internal sphincter but it is difficult to tell for sure. I recommended that he proceed with anal exam under anesthesia and fistulotomy versus mucosal advancement flap depending on how much muscle was involved.  The risk and benefits were explained to the patient in detail. He understands that there is a small risk of incontinence with fistulotomy but that this should be minimal. He  understands that if a mucosal advancement flap was performed, the recurrence rate can be as high as 20-30%.   Vanita Panda, MD Colon and Rectal Surgery / General Surgery [facility]      Visit Diagnoses: 1. Anal fistula     Primary Care Physician: Kristian Covey, MD

## 2012-12-08 ENCOUNTER — Ambulatory Visit (INDEPENDENT_AMBULATORY_CARE_PROVIDER_SITE_OTHER): Payer: 59 | Admitting: Family Medicine

## 2012-12-08 ENCOUNTER — Encounter: Payer: Self-pay | Admitting: Family Medicine

## 2012-12-08 ENCOUNTER — Encounter (HOSPITAL_BASED_OUTPATIENT_CLINIC_OR_DEPARTMENT_OTHER): Payer: Self-pay | Admitting: *Deleted

## 2012-12-08 VITALS — BP 128/80 | HR 67 | Temp 97.9°F | Ht 73.0 in | Wt 245.0 lb

## 2012-12-08 DIAGNOSIS — R7303 Prediabetes: Secondary | ICD-10-CM

## 2012-12-08 DIAGNOSIS — Z0000 Encounter for general adult medical examination without abnormal findings: Secondary | ICD-10-CM

## 2012-12-08 DIAGNOSIS — R7309 Other abnormal glucose: Secondary | ICD-10-CM

## 2012-12-08 MED ORDER — ZOLPIDEM TARTRATE 10 MG PO TABS: 10.0000 mg | ORAL_TABLET | Freq: Every evening | ORAL | Status: DC | PRN

## 2012-12-08 NOTE — Progress Notes (Signed)
Subjective:    Patient ID: [name], male    DOB: 1960-05-29, 52 y.o.   MRN: [id number]  HPI Patient seen for complete physical. He has surgery scheduled tomorrow for anal fistula. Generally feeling well. He takes no regular prescription medications. He is requesting one refill Ambien which he uses infrequently. Patient quit smoking a few years ago. No chronic lung disease. He's had flu vaccine. He's had previous colon polyps and will be due for repeat colonoscopy in 2015. No regular use of any prescription medications.  Past Medical History  Diagnosis Date  . Anal fistula    Past Surgical History  Procedure Laterality Date  . Examination under anesthesia N/A 08/03/2012    Procedure: placement of seton drain;  Surgeon: Robyne Askew, MD;  Location: [facility] OR;  Service: General;  Laterality: N/A;  placement of seton drain  . Orif left ankle fx  1981    HARDWARE REMOVED   . Excision forgein body and granuloma right index finger  07-18-2005    reports that he quit smoking about 2 years ago. His smoking use included Cigarettes. He has a 35 pack-year smoking history. He has never used smokeless tobacco. He reports that he drinks about 3.6 ounces of alcohol per week. He reports that he does not use illicit drugs. family history includes Cancer in his other and sister; Diabetes in his other; Mental illness in his other. Allergies  Allergen Reactions  . Adhesive [Tape] Other (See Comments)    Skin blister red  . Penicillins Rash      Review of Systems  Constitutional: Negative for fever, activity change, appetite change and fatigue.  HENT: Negative for congestion, ear pain and trouble swallowing.   Eyes: Negative for pain and visual disturbance.  Respiratory: Negative for cough, shortness of breath and wheezing.   Cardiovascular: Negative for chest pain and palpitations.  Gastrointestinal: Negative for nausea, vomiting, abdominal pain, diarrhea, constipation, blood in stool, abdominal  distention and rectal pain.  Endocrine: Negative for polydipsia and polyuria.  Genitourinary: Negative for dysuria, hematuria and testicular pain.  Musculoskeletal: Negative for arthralgias and joint swelling.  Skin: Negative for rash.  Neurological: Negative for dizziness, syncope and headaches.  Hematological: Negative for adenopathy.  Psychiatric/Behavioral: Negative for confusion and dysphoric mood.       Objective:   Physical Exam  Constitutional: He is oriented to person, place, and time. He appears well-developed and well-nourished. No distress.  HENT:  Head: Normocephalic and atraumatic.  Right Ear: External ear normal.  Left Ear: External ear normal.  Mouth/Throat: Oropharynx is clear and moist.  Eyes: Conjunctivae and EOM are normal. Pupils are equal, round, and reactive to light.  Neck: Normal range of motion. Neck supple. No thyromegaly present.  Cardiovascular: Normal rate, regular rhythm and normal heart sounds.   No murmur heard. Pulmonary/Chest: No respiratory distress. He has no wheezes. He has no rales.  Abdominal: Soft. Bowel sounds are normal. He exhibits no distension and no mass. There is no tenderness. There is no rebound and no guarding.  Genitourinary:  Deferred with scheduled surgery as above tomorrow for anal fistula  Musculoskeletal: He exhibits no edema.  Lymphadenopathy:    He has no cervical adenopathy.  Neurological: He is alert and oriented to person, place, and time. He displays normal reflexes. No cranial nerve deficit.  Skin: No rash noted.  Psychiatric: He has a normal mood and affect.          Assessment & Plan:  Complete physical.  Labs reviewed. Flu vaccine already given. Other immunizations up to date. Refill Ambien for when necessary use. He is encouraged to lose some weight

## 2012-12-08 NOTE — Progress Notes (Signed)
NPO AFTER MN. ARRIVE AT 0915. CURRENT LAB RESULTS IN EPIC AND CHART.

## 2012-12-08 NOTE — Progress Notes (Signed)
Pre visit review using our clinic review tool, if applicable. No additional management support is needed unless otherwise documented below in the visit note. 

## 2012-12-09 ENCOUNTER — Encounter (HOSPITAL_BASED_OUTPATIENT_CLINIC_OR_DEPARTMENT_OTHER): Payer: 59 | Admitting: Anesthesiology

## 2012-12-09 ENCOUNTER — Ambulatory Visit (HOSPITAL_BASED_OUTPATIENT_CLINIC_OR_DEPARTMENT_OTHER): Payer: 59 | Admitting: Anesthesiology

## 2012-12-09 ENCOUNTER — Ambulatory Visit (HOSPITAL_BASED_OUTPATIENT_CLINIC_OR_DEPARTMENT_OTHER)
Admission: RE | Admit: 2012-12-09 | Discharge: 2012-12-09 | Disposition: A | Discharge status: Home / Self Care | Payer: 59 | Source: Ambulatory Visit | Attending: General Surgery | Admitting: General Surgery

## 2012-12-09 ENCOUNTER — Encounter (HOSPITAL_BASED_OUTPATIENT_CLINIC_OR_DEPARTMENT_OTHER): Payer: Self-pay | Admitting: *Deleted

## 2012-12-09 ENCOUNTER — Encounter (HOSPITAL_BASED_OUTPATIENT_CLINIC_OR_DEPARTMENT_OTHER)
Admission: RE | Disposition: A | Discharge status: Home / Self Care | Payer: Self-pay | Source: Ambulatory Visit | Attending: General Surgery

## 2012-12-09 DIAGNOSIS — K603 Anal fistula, unspecified: Secondary | ICD-10-CM | POA: Insufficient documentation

## 2012-12-09 DIAGNOSIS — Z860100 Personal history of colon polyps, unspecified: Secondary | ICD-10-CM | POA: Insufficient documentation

## 2012-12-09 DIAGNOSIS — Z87891 Personal history of nicotine dependence: Secondary | ICD-10-CM | POA: Insufficient documentation

## 2012-12-09 DIAGNOSIS — K648 Other hemorrhoids: Secondary | ICD-10-CM | POA: Insufficient documentation

## 2012-12-09 HISTORY — DX: Other seasonal allergic rhinitis: J30.2

## 2012-12-09 HISTORY — PX: EVALUATION UNDER ANESTHESIA WITH ANAL FISTULECTOMY: SHX5621

## 2012-12-09 SURGERY — EXAM UNDER ANESTHESIA WITH ANAL FISTULECTOMY
Anesthesia: General | Site: Anus | Wound class: Clean Contaminated

## 2012-12-09 MED ORDER — SODIUM CHLORIDE 0.9 % IV SOLN
250.0000 mL | INTRAVENOUS | Status: DC | PRN
  Filled 2012-12-09: qty 250

## 2012-12-09 MED ORDER — PROMETHAZINE HCL 25 MG/ML IJ SOLN
6.2500 mg | INTRAMUSCULAR | Status: DC | PRN
  Filled 2012-12-09: qty 1

## 2012-12-09 MED ORDER — SODIUM CHLORIDE 0.9 % IJ SOLN
3.0000 mL | Freq: Two times a day (BID) | INTRAMUSCULAR | Status: DC
  Filled 2012-12-09: qty 3

## 2012-12-09 MED ORDER — OXYCODONE HCL 5 MG PO TABS
5.0000 mg | ORAL_TABLET | ORAL | Status: DC | PRN
  Filled 2012-12-09: qty 2

## 2012-12-09 MED ORDER — DOCUSATE SODIUM 100 MG PO CAPS: 100.0000 mg | ORAL_CAPSULE | Freq: Two times a day (BID) | ORAL | Status: DC

## 2012-12-09 MED ORDER — LACTATED RINGERS IV SOLN
INTRAVENOUS | Status: DC
  Administered 2012-12-09: 10:00:00 via INTRAVENOUS
  Filled 2012-12-09: qty 1000

## 2012-12-09 MED ORDER — PROPOFOL 10 MG/ML IV BOLUS
INTRAVENOUS | Status: DC | PRN
  Administered 2012-12-09: 250 mg via INTRAVENOUS

## 2012-12-09 MED ORDER — SODIUM CHLORIDE 0.9 % IJ SOLN
3.0000 mL | INTRAMUSCULAR | Status: DC | PRN
  Filled 2012-12-09: qty 3

## 2012-12-09 MED ORDER — ACETAMINOPHEN 650 MG RE SUPP
650.0000 mg | RECTAL | Status: DC | PRN
  Filled 2012-12-09: qty 1

## 2012-12-09 MED ORDER — ACETAMINOPHEN 325 MG PO TABS
650.0000 mg | ORAL_TABLET | ORAL | Status: DC | PRN
  Filled 2012-12-09: qty 2

## 2012-12-09 MED ORDER — LIDOCAINE HCL (CARDIAC) 20 MG/ML IV SOLN
INTRAVENOUS | Status: DC | PRN
  Administered 2012-12-09: 100 mg via INTRAVENOUS

## 2012-12-09 MED ORDER — LIDOCAINE 5 % EX OINT
TOPICAL_OINTMENT | CUTANEOUS | Status: DC | PRN
  Administered 2012-12-09: 1

## 2012-12-09 MED ORDER — DEXAMETHASONE SODIUM PHOSPHATE 4 MG/ML IJ SOLN
INTRAMUSCULAR | Status: DC | PRN
  Administered 2012-12-09: 8 mg via INTRAVENOUS

## 2012-12-09 MED ORDER — FENTANYL CITRATE 0.05 MG/ML IJ SOLN
INTRAMUSCULAR | Status: DC | PRN
  Administered 2012-12-09 (×2): 50 ug via INTRAVENOUS

## 2012-12-09 MED ORDER — KETOROLAC TROMETHAMINE 30 MG/ML IJ SOLN
15.0000 mg | Freq: Once | INTRAMUSCULAR | Status: DC | PRN
  Filled 2012-12-09: qty 1

## 2012-12-09 MED ORDER — MIDAZOLAM HCL 5 MG/5ML IJ SOLN
INTRAMUSCULAR | Status: DC | PRN
  Administered 2012-12-09: 2 mg via INTRAVENOUS

## 2012-12-09 MED ORDER — HYDROCODONE-ACETAMINOPHEN 5-325 MG PO TABS: 1.0000 | ORAL_TABLET | ORAL | Status: DC | PRN

## 2012-12-09 MED ORDER — ONDANSETRON HCL 4 MG/2ML IJ SOLN
4.0000 mg | Freq: Four times a day (QID) | INTRAMUSCULAR | Status: DC | PRN
  Filled 2012-12-09: qty 2

## 2012-12-09 MED ORDER — BUPIVACAINE-EPINEPHRINE 0.5% -1:200000 IJ SOLN
INTRAMUSCULAR | Status: DC | PRN
  Administered 2012-12-09: 20 mL

## 2012-12-09 MED ORDER — FENTANYL CITRATE 0.05 MG/ML IJ SOLN
25.0000 ug | INTRAMUSCULAR | Status: DC | PRN
  Filled 2012-12-09: qty 1

## 2012-12-09 MED ORDER — SUCCINYLCHOLINE CHLORIDE 20 MG/ML IJ SOLN
INTRAMUSCULAR | Status: DC | PRN
  Administered 2012-12-09: 100 mg via INTRAVENOUS

## 2012-12-09 MED ORDER — ONDANSETRON HCL 4 MG/2ML IJ SOLN
INTRAMUSCULAR | Status: DC | PRN
  Administered 2012-12-09: 4 mg via INTRAVENOUS

## 2012-12-09 SURGICAL SUPPLY — 63 items
BENZOIN TINCTURE PRP APPL 2/3 (GAUZE/BANDAGES/DRESSINGS) ×3 IMPLANT
BLADE HEX COATED 2.75 (ELECTRODE) ×3 IMPLANT
BLADE SURG 10 STRL SS (BLADE) IMPLANT
BLADE SURG 15 STRL LF DISP TIS (BLADE) ×2 IMPLANT
BLADE SURG 15 STRL SS (BLADE) ×1
BRIEF STRETCH FOR OB PAD LRG (UNDERPADS AND DIAPERS) ×6 IMPLANT
CANISTER SUCTION 2500CC (MISCELLANEOUS) ×3 IMPLANT
CANNULA VESSEL W/WING WO/VALVE (CANNULA) ×3 IMPLANT
CLOTH BEACON ORANGE TIMEOUT ST (SAFETY) ×3 IMPLANT
COVER MAYO STAND STRL (DRAPES) ×3 IMPLANT
COVER TABLE BACK 60X90 (DRAPES) ×3 IMPLANT
DECANTER SPIKE VIAL GLASS SM (MISCELLANEOUS) ×3 IMPLANT
DRAPE LAPAROTOMY T 102X78X121 (DRAPES) ×3 IMPLANT
DRAPE LG THREE QUARTER DISP (DRAPES) ×6 IMPLANT
DRAPE PED LAPAROTOMY (DRAPES) ×3 IMPLANT
DRAPE UNDERBUTTOCKS STRL (DRAPE) IMPLANT
DRSG PAD ABDOMINAL 8X10 ST (GAUZE/BANDAGES/DRESSINGS) IMPLANT
ELECT BLADE 6.5 .24CM SHAFT (ELECTRODE) IMPLANT
ELECT BLADE TIP CTD 4 INCH (ELECTRODE) ×3 IMPLANT
ELECT REM PT RETURN 9FT ADLT (ELECTROSURGICAL) ×3
ELECTRODE REM PT RTRN 9FT ADLT (ELECTROSURGICAL) ×2 IMPLANT
GAUZE SPONGE 4X4 12PLY STRL LF (GAUZE/BANDAGES/DRESSINGS) IMPLANT
GAUZE SPONGE 4X4 16PLY XRAY LF (GAUZE/BANDAGES/DRESSINGS) ×3 IMPLANT
GAUZE VASELINE 3X9 (GAUZE/BANDAGES/DRESSINGS) IMPLANT
GLOVE BIO SURGEON STRL SZ 6.5 (GLOVE) ×6 IMPLANT
GLOVE BIOGEL PI IND STRL 7.0 (GLOVE) ×2 IMPLANT
GLOVE BIOGEL PI INDICATOR 7.0 (GLOVE) ×1
GLOVE INDICATOR 7.0 STRL GRN (GLOVE) ×6 IMPLANT
GOWN BRE IMP PREV XXLGXLNG (GOWN DISPOSABLE) ×3 IMPLANT
GOWN PREVENTION PLUS LG XLONG (DISPOSABLE) ×3 IMPLANT
GOWN PREVENTION PLUS XLARGE (GOWN DISPOSABLE) IMPLANT
GOWN STRL REIN XL XLG (GOWN DISPOSABLE) IMPLANT
IV CATH 14GX2 1/4 (CATHETERS) IMPLANT
LEGGING LITHOTOMY PAIR STRL (DRAPES) IMPLANT
LOOP VESSEL MAXI BLUE (MISCELLANEOUS) IMPLANT
LUBRICANT JELLY K Y 4OZ (MISCELLANEOUS) ×3 IMPLANT
NDL SAFETY ECLIPSE 18X1.5 (NEEDLE) IMPLANT
NEEDLE HYPO 18GX1.5 SHARP (NEEDLE)
NEEDLE HYPO 25X1 1.5 SAFETY (NEEDLE) ×3 IMPLANT
NS IRRIG 1000ML POUR BTL (IV SOLUTION) ×3 IMPLANT
NS IRRIG 500ML POUR BTL (IV SOLUTION) ×3 IMPLANT
PACK BASIN DAY SURGERY FS (CUSTOM PROCEDURE TRAY) ×3 IMPLANT
PENCIL BUTTON HOLSTER BLD 10FT (ELECTRODE) ×3 IMPLANT
SPONGE GAUZE 4X4 12PLY (GAUZE/BANDAGES/DRESSINGS) ×3 IMPLANT
SPONGE SURGIFOAM ABS GEL 12-7 (HEMOSTASIS) IMPLANT
SUCTION FRAZIER TIP 10 FR DISP (SUCTIONS) ×3 IMPLANT
SUT CHROMIC 2 0 SH (SUTURE) IMPLANT
SUT CHROMIC 3 0 SH 27 (SUTURE) ×3 IMPLANT
SUT ETHIBOND 0 (SUTURE) IMPLANT
SUT GUT CHROMIC 3 0 (SUTURE) IMPLANT
SUT MON AB 3-0 SH 27 (SUTURE)
SUT MON AB 3-0 SH27 (SUTURE) IMPLANT
SUT VIC AB 2-0 SH 27 (SUTURE)
SUT VIC AB 2-0 SH 27X BRD (SUTURE) IMPLANT
SUT VIC AB 4-0 P-3 18XBRD (SUTURE) IMPLANT
SUT VIC AB 4-0 P3 18 (SUTURE)
SYR 20CC LL (SYRINGE) IMPLANT
SYR CONTROL 10ML LL (SYRINGE) ×3 IMPLANT
SYRINGE 10CC LL (SYRINGE) ×3 IMPLANT
TOWEL OR 17X24 6PK STRL BLUE (TOWEL DISPOSABLE) ×6 IMPLANT
TRAY DSU PREP LF (CUSTOM PROCEDURE TRAY) ×3 IMPLANT
TUBE CONNECTING 12X1/4 (SUCTIONS) ×3 IMPLANT
YANKAUER SUCT BULB TIP NO VENT (SUCTIONS) ×3 IMPLANT

## 2012-12-09 NOTE — H&P (View-Only) (Signed)
Chief Complaint  Patient presents with  . New Evaluation    eval for advancement flap    HISTORY: [name] is a 52 y.o. male who presents to the office with an anorectal fistula.  Other symptoms include irritation and occasional drainage.  He is s/p I&D by Dr Toth and developed a perirectal fistula.  A seton was placed 3 months ago. His bowel habits are regular and his bowel movements are soft.  His fiber intake is dietary.  His last colonoscopy was in 2012 and is due again next year due to multiple polyps.  He denies rectal bleeding.   He denies any chronic diarrhea or difficulty with fecal incontinence.  Past Medical History  Diagnosis Date  . Allergy     seasonal  . 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 CORNS AND CALLUSES 08/24/2008      Past Surgical History  Procedure Laterality Date  . Ankle fracture surgery      left ankle  . Eye surgery Bilateral     Lasik  . Examination under anesthesia N/A 08/03/2012    Procedure: placement of seton drain;  Surgeon: Paul S Toth III, MD;  Location: [facility] OR;  Service: General;  Laterality: N/A;  placement of seton drain        Current Outpatient Prescriptions  Medication Sig Dispense Refill  . co-enzyme Q-10 50 MG capsule Take 50 mg by mouth daily.      . glucosamine-chondroitin 500-400 MG tablet Take 1 tablet by mouth 2 (two) times daily.       . ibuprofen (ADVIL,MOTRIN) 200 MG tablet Take 400 mg by mouth every 6 (six) hours as needed for pain.      . Red Yeast Rice 600 MG CAPS Take 600 mg by mouth 2 (two) times daily.       . zolpidem (AMBIEN) 10 MG tablet Take 5 mg by mouth at bedtime as needed for sleep.       No current facility-administered medications for this visit.      Allergies  Allergen Reactions  . Penicillins     REACTION: rash as a child      Family History  Problem Relation Age of Onset  . Diabetes Other   . Mental illness Other   . Cancer Other     breast, prostate  . Cancer Sister     History   Social History  .  Marital Status: Married    Spouse Name: N/A    Number of Children: N/A  . Years of Education: N/A   Social History Main Topics  . Smoking status: Former Smoker -- 1.00 packs/day for 35 years    Types: Cigarettes    Quit date: 12/18/2009  . Smokeless tobacco: Never Used  . Alcohol Use: 3.6 oz/week    6 Cans of beer per week  . Drug Use: No  . Sexual Activity: None   Other Topics Concern  . None   Social History Narrative  . None      REVIEW OF SYSTEMS - PERTINENT POSITIVES ONLY: Review of Systems - General ROS: negative for - chills, fever or weight loss Hematological and Lymphatic ROS: negative for - bleeding problems, blood clots or bruising Respiratory ROS: no cough, shortness of breath, or wheezing Cardiovascular ROS: no chest pain or dyspnea on exertion Gastrointestinal ROS: no abdominal pain, change in bowel habits, or black or bloody stools Genito-Urinary ROS: no dysuria, trouble voiding, or hematuria  EXAM: Filed Vitals:   12/07/12 1004    BP: 130/70  Pulse: 70  Temp: 97.1 F (36.2 C)  Resp: 14    General appearance: alert and cooperative Resp: clear to auscultation bilaterally Cardio: regular rate and rhythm GI: normal findings: soft, non-tender  Perianal Findings: seton in place, appears to be mainly involving internal sphincter    ASSESSMENT AND PLAN:  [name] is a 52-year-old man who is status post seton placement for a perianal fistula approximately 3 months ago. He presents today for evaluation from Dr. Toth.  On exam it appears that his seton appears to involve mostly internal sphincter but it is difficult to tell for sure. I recommended that he proceed with anal exam under anesthesia and fistulotomy versus mucosal advancement flap depending on how much muscle was involved.  The risk and benefits were explained to the patient in detail. He understands that there is a small risk of incontinence with fistulotomy but that this should be minimal. He  understands that if a mucosal advancement flap was performed, the recurrence rate can be as high as 20-30%.   Carlos Furr C Arel Tippen, MD Colon and Rectal Surgery / General Surgery [facility]      Visit Diagnoses: 1. Anal fistula     Primary Care Physician: BURCHETTE,BRUCE W, MD  

## 2012-12-09 NOTE — Transfer of Care (Signed)
Immediate Anesthesia Transfer of Care Note  Patient: [name]  Procedure(s) Performed: Procedure(s) (LRB): EXAM UNDER ANESTHESIA, FISTULOTOMY (N/A)  Patient Location: PACU  Anesthesia Type: General  Level of Consciousness: awake, alert  and oriented  Airway & Oxygen Therapy: Patient Spontanous Breathing and Patient connected to face mask oxygen  Post-op Assessment: Report given to PACU RN and Post -op Vital signs reviewed and stable  Post vital signs: Reviewed and stable  Complications: No apparent anesthesia complications

## 2012-12-09 NOTE — Interval H&P Note (Signed)
History and Physical Interval Note:  12/09/2012 10:28 AM  [name]  has presented today for surgery, with the diagnosis of perianal fistula   The various methods of treatment have been discussed with the patient and family. After consideration of risks, benefits and other options for treatment, the patient has consented to  Procedure(s): EXAM UNDER ANESTHESIA WITH possible ANAL FISTULECTOMY (N/A) POSSIBLE MUCOSAL ADVANCEMENT FLAP (N/A) as a surgical intervention .  The patient's history has been reviewed, patient examined, no change in status, stable for surgery.  I have reviewed the patient's chart and labs.  Questions were answered to the patient's satisfaction.     Vanita Panda, MD  Colorectal and General Surgery [facility]

## 2012-12-09 NOTE — Anesthesia Preprocedure Evaluation (Addendum)
Anesthesia Evaluation  Patient identified by MRN, date of birth, ID band Patient awake    Reviewed: Allergy & Precautions, H&P , NPO status , Patient's Chart, lab work & pertinent test results  Airway Mallampati: II TM Distance: >3 FB Neck ROM: Full    Dental no notable dental hx.    Pulmonary neg pulmonary ROS, former smoker,  breath sounds clear to auscultation  Pulmonary exam normal       Cardiovascular negative cardio ROS  Rhythm:Regular Rate:Normal     Neuro/Psych negative neurological ROS  negative psych ROS   GI/Hepatic negative GI ROS, Neg liver ROS,   Endo/Other  negative endocrine ROS  Renal/GU negative Renal ROS  negative genitourinary   Musculoskeletal negative musculoskeletal ROS (+)   Abdominal   Peds negative pediatric ROS (+)  Hematology negative hematology ROS (+)   Anesthesia Other Findings   Reproductive/Obstetrics negative OB ROS                          Anesthesia Physical Anesthesia Plan  ASA: II  Anesthesia Plan: General   Post-op Pain Management:    Induction: Intravenous  Airway Management Planned: Oral ETT  Additional Equipment:   Intra-op Plan:   Post-operative Plan: Extubation in OR  Informed Consent: I have reviewed the patients History and Physical, chart, labs and discussed the procedure including the risks, benefits and alternatives for the proposed anesthesia with the patient or authorized representative who has indicated his/her understanding and acceptance.   Dental advisory given  Plan Discussed with: CRNA and Surgeon  Anesthesia Plan Comments:         Anesthesia Quick Evaluation  

## 2012-12-09 NOTE — Op Note (Signed)
12/09/2012  11:23 AM  PATIENT:  [name]  52 y.o. male  Patient Care Team: Kristian Covey, MD as PCP - General  PRE-OPERATIVE DIAGNOSIS:  perianal fistula   POST-OPERATIVE DIAGNOSIS:  perianal fistula   PROCEDURE:  ANAL EXAM UNDER ANESTHESIA, FISTULOTOMY  SURGEON:  Surgeon(s): Romie Levee, MD  ASSISTANT: none   ANESTHESIA:   local and general  SPECIMEN:  No Specimen  DISPOSITION OF SPECIMEN:  N/A  COUNTS:  YES  PLAN OF CARE: Discharge to home after PACU  PATIENT DISPOSITION:  PACU - hemodynamically stable.  INDICATION: this is a 52 year old male who is approximately 3 months status post seton placement for a posterior anal fistula. He presents today for definitive management. The risk and benefits of all surgical options were explained to the patient prior the alert and consent was signed and placed on chart.   OR FINDINGS: posterior anal fistula encompassing a minimal amount of internal sphincter but mostly scar tissue. Slight anal stenosis but no sphincter hypertension.  DESCRIPTION: the patient was identified in the preoperative holding area and taken to the OR where they were laid on the operating room table.  General anesthesia was induced without difficulty. The patient was then positioned in prone jackknife position with buttocks gently taped apart.  The patient was then prepped and draped in usual sterile fashion.  SCDs were noted to be in place prior to the initiation of anesthesia. A surgical timeout was performed indicating the correct patient, procedure, positioning and need for preoperative antibiotics.  I began with a digital rectal exam.  There were no palpable masses noted. The seton was in place a posterior midline.  I then placed a Hill-Ferguson anoscope into the anal canal and evaluated this completely.  He was noted to have minimal hemorrhoid disease in the left posterior region internally. He had no signs of external hemorrhoid disease. There were no  fluctuant masses noted. At this point a fistula probe was inserted into the area where the seton was placed.. This came out internally just above the dentate line. This did not appear to involve much muscle at all. There appeared to be a small amount of internal sphincter over this fistula but it was felt that this would be safe for fistulotomy. This was done using Bovie electrocautery. The edges of the fistulotomy tract were then marsupialized using a 3-0 chromic suture. The base of the fistula tract was cauterized.  I then performed a rectal block with half percent Marcaine with epinephrine.The patient was then awakened from anesthesia and sent to the post anesthesia care unit in stable condition. All counts were correct per operating room staff.

## 2012-12-09 NOTE — Anesthesia Procedure Notes (Signed)
Procedure Name: Intubation Date/Time: 12/09/2012 10:55 AM Performed by: Norva Pavlov Pre-anesthesia Checklist: Patient identified, Emergency Drugs available, Suction available and Patient being monitored Patient Re-evaluated:Patient Re-evaluated prior to inductionOxygen Delivery Method: Circle System Utilized Preoxygenation: Pre-oxygenation with 100% oxygen Intubation Type: IV induction Ventilation: Mask ventilation without difficulty Laryngoscope Size: Mac and 4 Grade View: Grade I Tube type: Oral Tube size: 8.0 mm Number of attempts: 1 Airway Equipment and Method: stylet Placement Confirmation: ETT inserted through vocal cords under direct vision,  positive ETCO2 and breath sounds checked- equal and bilateral Secured at: 23 cm Tube secured with: Tape Dental Injury: Teeth and Oropharynx as per pre-operative assessment

## 2012-12-09 NOTE — Anesthesia Postprocedure Evaluation (Signed)
  Anesthesia Post-op Note  Patient: [name]  Procedure(s) Performed: Procedure(s) (LRB): EXAM UNDER ANESTHESIA, FISTULOTOMY (N/A)  Patient Location: PACU  Anesthesia Type: General  Level of Consciousness: awake and alert   Airway and Oxygen Therapy: Patient Spontanous Breathing  Post-op Pain: mild  Post-op Assessment: Post-op Vital signs reviewed, Patient's Cardiovascular Status Stable, Respiratory Function Stable, Patent Airway and No signs of Nausea or vomiting  Last Vitals:  Filed Vitals:   12/09/12 1211  BP: 114/72  Pulse: 67  Temp:   Resp: 15    Post-op Vital Signs: stable   Complications: No apparent anesthesia complications

## 2012-12-13 ENCOUNTER — Encounter (HOSPITAL_BASED_OUTPATIENT_CLINIC_OR_DEPARTMENT_OTHER): Payer: Self-pay | Admitting: General Surgery

## 2012-12-21 ENCOUNTER — Ambulatory Visit (INDEPENDENT_AMBULATORY_CARE_PROVIDER_SITE_OTHER): Payer: 59 | Admitting: Family Medicine

## 2012-12-21 ENCOUNTER — Encounter: Payer: Self-pay | Admitting: Family Medicine

## 2012-12-21 VITALS — BP 124/80 | Temp 98.9°F | Wt 247.0 lb

## 2012-12-21 DIAGNOSIS — L089 Local infection of the skin and subcutaneous tissue, unspecified: Secondary | ICD-10-CM

## 2012-12-21 MED ORDER — CEPHALEXIN 500 MG PO CAPS: 500.0000 mg | ORAL_CAPSULE | Freq: Two times a day (BID) | ORAL | Status: DC

## 2012-12-21 MED ORDER — SULFAMETHOXAZOLE-TMP DS 800-160 MG PO TABS: 1.0000 | ORAL_TABLET | Freq: Two times a day (BID) | ORAL | Status: DC

## 2012-12-21 NOTE — Progress Notes (Signed)
Pre visit review using our clinic review tool, if applicable. No additional management support is needed unless otherwise documented below in the visit note. 

## 2012-12-21 NOTE — Progress Notes (Signed)
Chief Complaint  Patient presents with  . finger infection    left index    HPI:  Acute visit for finger pain: -started today after trimming hedges yesterday -redness, swelling and pain in distal index finger -denies: inability to move joint, malaise, fevers, rapidly spreading or redness  ROS: See pertinent positives and negatives per HPI.  Past Medical History  Diagnosis Date  . Anal fistula     Past Surgical History  Procedure Laterality Date  . Examination under anesthesia N/A 08/03/2012    Procedure: placement of seton drain;  Surgeon: Robyne Askew, MD;  Location: [facility] OR;  Service: General;  Laterality: N/A;  placement of seton drain  . Orif left ankle fx  1981    HARDWARE REMOVED   . Excision forgein body and granuloma right index finger  07-18-2005  . Evaluation under anesthesia with anal fistulectomy N/A 12/09/2012    Procedure: EXAM UNDER ANESTHESIA, FISTULOTOMY;  Surgeon: Romie Levee, MD;  Location: [facility] [place];  Service: General;  Laterality: N/A;    Family History  Problem Relation Age of Onset  . Diabetes Other   . Mental illness Other   . Cancer Other     breast, prostate  . Cancer Sister     History   Social History  . Marital Status: Married    Spouse Name: N/A    Number of Children: N/A  . Years of Education: N/A   Social History Main Topics  . Smoking status: Former Smoker -- 1.00 packs/day for 35 years    Types: Cigarettes    Quit date: 12/18/2009  . Smokeless tobacco: Never Used  . Alcohol Use: 3.6 oz/week    6 Cans of beer per week  . Drug Use: No  . Sexual Activity: None   Other Topics Concern  . None   Social History Narrative  . None    Current outpatient prescriptions:co-enzyme Q-10 50 MG capsule, Take 50 mg by mouth daily., Disp: , Rfl: ;  glucosamine-chondroitin 500-400 MG tablet, Take 1 tablet by mouth 2 (two) times daily. , Disp: , Rfl: ;  HYDROcodone-acetaminophen (NORCO/VICODIN) 5-325 MG per tablet, Take  1-2 tablets by mouth every 4 (four) hours as needed., Disp: 30 tablet, Rfl: 0 ibuprofen (ADVIL,MOTRIN) 200 MG tablet, Take 400 mg by mouth every 6 (six) hours as needed for pain., Disp: , Rfl: ;  Red Yeast Rice 600 MG CAPS, Take 600 mg by mouth 2 (two) times daily. , Disp: , Rfl: ;  zolpidem (AMBIEN) 10 MG tablet, Take 1 tablet (10 mg total) by mouth at bedtime as needed for sleep., Disp: 30 tablet, Rfl: 1 cephALEXin (KEFLEX) 500 MG capsule, Take 1 capsule (500 mg total) by mouth 2 (two) times daily., Disp: 21 capsule, Rfl: 0;  sulfamethoxazole-trimethoprim (BACTRIM DS) 800-160 MG per tablet, Take 1 tablet by mouth 2 (two) times daily., Disp: 14 tablet, Rfl: 0  EXAM:  Filed Vitals:   12/21/12 1524  BP: 124/80  Temp: 98.9 F (37.2 C)    Body mass index is 32.59 kg/(m^2).  GENERAL: vitals reviewed and listed above, alert, oriented, appears well hydrated and in no acute distress  HEENT: atraumatic, conjunttiva clear, no obvious abnormalities on inspection of external nose and ears  NECK: no obvious masses on inspection  SKIN: mild redness and swelling of distal L index finger without obvious abscess   MS: moves all extremities without noticeable abnormality  PSYCH: pleasant and cooperative, no obvious depression or anxiety  ASSESSMENT  AND PLAN:  Discussed the following assessment and plan:  Finger infection - Plan: Ambulatory referral to Hand Surgery, sulfamethoxazole-trimethoprim (BACTRIM DS) 800-160 MG per tablet, cephALEXin (KEFLEX) 500 MG capsule  -mild erythema and swelling on index finger after trimming roses -pt is well aware of risks associated with hand/finger infections as his friend had severe complications associated with finger infection -discussed risks and options, tried to get appt with hand specialist but none available today or tomorrow -he does not wish to go to ED at this time and given minor nature of findings at this time will attempt oral abx per his  wishes -will do bactrim and keflex and he will closely observe in the meantime and if any worsening will go to the emergency room immediatley -Patient advised to return or notify a doctor immediately if symptoms worsen or persist or new concerns arise.  There are no Patient Instructions on file for this visit.   Carlos Basque R.

## 2012-12-27 ENCOUNTER — Encounter (INDEPENDENT_AMBULATORY_CARE_PROVIDER_SITE_OTHER): Payer: Self-pay | Admitting: General Surgery

## 2012-12-27 ENCOUNTER — Ambulatory Visit (INDEPENDENT_AMBULATORY_CARE_PROVIDER_SITE_OTHER): Payer: 59 | Admitting: General Surgery

## 2012-12-27 ENCOUNTER — Encounter (INDEPENDENT_AMBULATORY_CARE_PROVIDER_SITE_OTHER): Payer: 59 | Admitting: General Surgery

## 2012-12-27 VITALS — BP 128/80 | HR 66 | Temp 97.2°F | Resp 18 | Wt 239.0 lb

## 2012-12-27 DIAGNOSIS — Z9889 Other specified postprocedural states: Secondary | ICD-10-CM

## 2012-12-27 NOTE — Progress Notes (Signed)
[name]. is a 52 y.o. male who is status post a fistulotomy on 11/20.  He is doing well.  Denies pain.  Having reg BM's.  Denies any drainage or fecal leakage.   Objective: Filed Vitals:   12/27/12 1048  BP: 128/80  Pulse: 66  Temp: 97.2 F (36.2 C)  Resp: 18    General appearance: alert and cooperative GI: normal findings: soft, non-tender  Incision: healing well   Assessment: s/p  Patient Active Problem List   Diagnosis Date Noted  . Prediabetes 12/08/2012  . Anal fistula 08/17/2012  . Colon polyps 09/18/2010  . 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 CORNS AND CALLUSES 08/24/2008    Plan: Doing well after fistulotomy.  RTO PRN    .Vanita Panda, MD [facility], [place] [phone]   12/27/2012 10:56 AM

## 2012-12-27 NOTE — Patient Instructions (Signed)
Call the office if you develop any issues

## 2013-04-28 ENCOUNTER — Encounter: Payer: Self-pay | Admitting: Family Medicine

## 2013-04-28 ENCOUNTER — Ambulatory Visit (INDEPENDENT_AMBULATORY_CARE_PROVIDER_SITE_OTHER): Payer: 59 | Admitting: Family Medicine

## 2013-04-28 ENCOUNTER — Ambulatory Visit (INDEPENDENT_AMBULATORY_CARE_PROVIDER_SITE_OTHER)
Admission: RE | Admit: 2013-04-28 | Discharge: 2013-04-28 | Disposition: A | Discharge status: Home / Self Care | Payer: 59 | Source: Ambulatory Visit | Attending: Family Medicine | Admitting: Family Medicine

## 2013-04-28 VITALS — BP 126/74 | HR 74 | Temp 97.7°F | Wt 254.0 lb

## 2013-04-28 DIAGNOSIS — R059 Cough, unspecified: Secondary | ICD-10-CM

## 2013-04-28 DIAGNOSIS — K219 Gastro-esophageal reflux disease without esophagitis: Secondary | ICD-10-CM

## 2013-04-28 DIAGNOSIS — R05 Cough: Secondary | ICD-10-CM

## 2013-04-28 DIAGNOSIS — R053 Chronic cough: Secondary | ICD-10-CM

## 2013-04-28 MED ORDER — PANTOPRAZOLE SODIUM 40 MG PO TBEC: 40.0000 mg | DELAYED_RELEASE_TABLET | Freq: Every day | ORAL | Status: DC

## 2013-04-28 NOTE — Progress Notes (Signed)
Pre visit review using our clinic review tool, if applicable. No additional management support is needed unless otherwise documented below in the visit note. 

## 2013-04-28 NOTE — Progress Notes (Signed)
   Subjective:    Patient ID: [name]., male    DOB: [birth year], 53 y.o.   MRN: [id number]  Cough Associated symptoms include a sore throat. Pertinent negatives include no chest pain, chills, fever, postnasal drip or shortness of breath.   Patient seen with 6 month history of what he describes chronic cough. More than anything he is clearing his throat frequently. Occasional mild sore throat symptoms. He denies any postnasal drip symptoms. Has a long history of smoking. He is concerned because a friend of his recently diagnosed with stage IV lung cancer. He's not had any weight changes. No hemoptysis. No dyspnea. Occasional hoarseness. He is not aware of any frequent GERD symptoms but does have occasional GERD symptoms after eating heavy meal. No dysphagia.  Past Medical History  Diagnosis Date  . Anal fistula    Past Surgical History  Procedure Laterality Date  . Examination under anesthesia N/A 08/03/2012    Procedure: placement of seton drain;  Surgeon: Merrie Roof, MD;  Location: [place];  Service: General;  Laterality: N/A;  placement of seton drain  . Orif left ankle fx  1981    HARDWARE REMOVED   . Excision forgein body and granuloma right index finger  07-18-2005  . Evaluation under anesthesia with anal fistulectomy N/A 12/09/2012    Procedure: EXAM UNDER ANESTHESIA, FISTULOTOMY;  Surgeon: Leighton Ruff, MD;  Location: [place];  Service: General;  Laterality: N/A;    reports that he quit smoking about 3 years ago. His smoking use included Cigarettes. He has a 35 pack-year smoking history. He has never used smokeless tobacco. He reports that he drinks about 3.6 ounces of alcohol per week. He reports that he does not use illicit drugs. family history includes Cancer in his other and sister; Diabetes in his other; Mental illness in his other. Allergies  Allergen Reactions  . Adhesive [Tape] Other (See Comments)    Skin blister red  . Penicillins Rash       Review of Systems  Constitutional: Negative for fever, chills and fatigue.  HENT: Positive for sore throat. Negative for postnasal drip and sinus pressure.   Respiratory: Positive for cough. Negative for shortness of breath.   Cardiovascular: Negative for chest pain, palpitations and leg swelling.  Gastrointestinal: Negative for abdominal pain.  Neurological: Negative for dizziness.       Objective:   Physical Exam  Constitutional: He appears well-developed and well-nourished.  HENT:  Right Ear: External ear normal.  Left Ear: External ear normal.  Mouth/Throat: Oropharynx is clear and moist.  Neck: Neck supple.  Cardiovascular: Normal rate and regular rhythm.   Pulmonary/Chest: Effort normal and breath sounds normal. No respiratory distress. He has no wheezes. He has no rales.  Lymphadenopathy:    He has no cervical adenopathy.          Assessment & Plan:  Chronic cough/chronic clearing of throat. Intermittent hoarseness. Suspect GERD related. GERD educational handout given. Consider elevate head of bed 6-8 inches. Quit smoking, protonix 40 mg once daily. Obtain chest x-ray. Touch base in one month if symptoms not resolving

## 2013-04-28 NOTE — Patient Instructions (Signed)
Gastroesophageal Reflux Disease, Adult  Gastroesophageal reflux disease (GERD) happens when acid from your stomach flows up into the esophagus. When acid comes in contact with the esophagus, the acid causes soreness (inflammation) in the esophagus. Over time, GERD may create small holes (ulcers) in the lining of the esophagus.  CAUSES   · Increased body weight. This puts pressure on the stomach, making acid rise from the stomach into the esophagus.  · Smoking. This increases acid production in the stomach.  · Drinking alcohol. This causes decreased pressure in the lower esophageal sphincter (valve or ring of muscle between the esophagus and stomach), allowing acid from the stomach into the esophagus.  · Late evening meals and a full stomach. This increases pressure and acid production in the stomach.  · A malformed lower esophageal sphincter.  Sometimes, no cause is found.  SYMPTOMS   · Burning pain in the lower part of the mid-chest behind the breastbone and in the mid-stomach area. This may occur twice a week or more often.  · Trouble swallowing.  · Sore throat.  · Dry cough.  · Asthma-like symptoms including chest tightness, shortness of breath, or wheezing.  DIAGNOSIS   Your caregiver may be able to diagnose GERD based on your symptoms. In some cases, X-rays and other tests may be done to check for complications or to check the condition of your stomach and esophagus.  TREATMENT   Your caregiver may recommend over-the-counter or prescription medicines to help decrease acid production. Ask your caregiver before starting or adding any new medicines.   HOME CARE INSTRUCTIONS   · Change the factors that you can control. Ask your caregiver for guidance concerning weight loss, quitting smoking, and alcohol consumption.  · Avoid foods and drinks that make your symptoms worse, such as:  · Caffeine or alcoholic drinks.  · Chocolate.  · Peppermint or mint flavorings.  · Garlic and onions.  · Spicy foods.  · Citrus fruits,  such as oranges, lemons, or limes.  · Tomato-based foods such as sauce, chili, salsa, and pizza.  · Fried and fatty foods.  · Avoid lying down for the 3 hours prior to your bedtime or prior to taking a nap.  · Eat small, frequent meals instead of large meals.  · Wear loose-fitting clothing. Do not wear anything tight around your waist that causes pressure on your stomach.  · Raise the head of your bed 6 to 8 inches with wood blocks to help you sleep. Extra pillows will not help.  · Only take over-the-counter or prescription medicines for pain, discomfort, or fever as directed by your caregiver.  · Do not take aspirin, ibuprofen, or other nonsteroidal anti-inflammatory drugs (NSAIDs).  SEEK IMMEDIATE MEDICAL CARE IF:   · You have pain in your arms, neck, jaw, teeth, or back.  · Your pain increases or changes in intensity or duration.  · You develop nausea, vomiting, or sweating (diaphoresis).  · You develop shortness of breath, or you faint.  · Your vomit is green, yellow, black, or looks like coffee grounds or blood.  · Your stool is red, bloody, or black.  These symptoms could be signs of other problems, such as heart disease, gastric bleeding, or esophageal bleeding.  MAKE SURE YOU:   · Understand these instructions.  · Will watch your condition.  · Will get help right away if you are not doing well or get worse.  Document Released: 10/16/2004 Document Revised: 03/31/2011 Document Reviewed: 07/26/2010  [employer]® Patient   Information ©2014 [employer].

## 2013-04-29 ENCOUNTER — Ambulatory Visit: Payer: 59 | Admitting: Family Medicine

## 2013-04-29 ENCOUNTER — Telehealth: Payer: Self-pay | Admitting: Family Medicine

## 2013-04-29 NOTE — Telephone Encounter (Signed)
Pt returning your call please call on cell phone.

## 2013-05-02 NOTE — Telephone Encounter (Signed)
Spoke with patient .  He does not have any symptoms or signs of overt heart failure.  If cough persists with treatment of GERD will recommend pulmonary referral and he agrees.

## 2013-05-02 NOTE — Telephone Encounter (Signed)
Pt informed about results. Pt would like for you to give him a call about results. Pt saw in the notes heart failure.

## 2013-05-03 ENCOUNTER — Encounter: Payer: Self-pay | Admitting: Gastroenterology

## 2013-05-03 ENCOUNTER — Telehealth: Payer: Self-pay | Admitting: Family Medicine

## 2013-05-03 NOTE — Telephone Encounter (Signed)
I received a fax denying Pantoprazole Sodium.  Pt must try and fail and OTC medication due to intolerability or lack of effectiveness.

## 2013-05-03 NOTE — Telephone Encounter (Signed)
Let pt know he must try Prilosec or Nexium OTC first ([name])

## 2013-05-04 NOTE — Telephone Encounter (Signed)
Pt informed and patient is trying Prilosec 20 mg once daily.

## 2013-05-12 ENCOUNTER — Encounter: Payer: Self-pay | Admitting: Gastroenterology

## 2013-05-23 ENCOUNTER — Other Ambulatory Visit (INDEPENDENT_AMBULATORY_CARE_PROVIDER_SITE_OTHER): Payer: 59

## 2013-05-23 DIAGNOSIS — Z0000 Encounter for general adult medical examination without abnormal findings: Secondary | ICD-10-CM

## 2013-05-23 LAB — LIPID PANEL
Cholesterol: 203 mg/dL — ABNORMAL HIGH (ref 0–200)
HDL: 49.5 mg/dL (ref >39.00)
LDL CALC: 136 mg/dL — AB (ref 0–99)
Total CHOL/HDL Ratio: 4
Triglycerides: 89 mg/dL (ref 0.0–149.0)
VLDL: 17.8 mg/dL (ref 0.0–40.0)

## 2013-05-23 LAB — POCT URINALYSIS DIPSTICK
Bilirubin, UA: NEGATIVE
Glucose, UA: NEGATIVE
Ketones, UA: NEGATIVE
Leukocytes, UA: NEGATIVE
NITRITE UA: NEGATIVE
Protein, UA: NEGATIVE
RBC UA: NEGATIVE
Spec Grav, UA: 1.02
UROBILINOGEN UA: 0.2
pH, UA: 7.5

## 2013-05-23 LAB — HEPATIC FUNCTION PANEL
ALBUMIN: 4 g/dL (ref 3.5–5.2)
ALT: 24 U/L (ref 0–53)
AST: 24 U/L (ref 0–37)
Alkaline Phosphatase: 53 U/L (ref 39–117)
Bilirubin, Direct: 0.1 mg/dL (ref 0.0–0.3)
Total Bilirubin: 0.4 mg/dL (ref 0.3–1.2)
Total Protein: 6.9 g/dL (ref 6.0–8.3)

## 2013-05-23 LAB — BASIC METABOLIC PANEL
BUN: 18 mg/dL (ref 6–23)
CALCIUM: 9.3 mg/dL (ref 8.4–10.5)
CHLORIDE: 102 meq/L (ref 96–112)
CO2: 31 mEq/L (ref 19–32)
Creatinine, Ser: 1 mg/dL (ref 0.4–1.5)
GFR: 85.97 mL/min (ref >60.00)
GLUCOSE: 111 mg/dL — AB (ref 70–99)
POTASSIUM: 5.1 meq/L (ref 3.5–5.1)
Sodium: 140 mEq/L (ref 135–145)

## 2013-05-23 LAB — CBC WITH DIFFERENTIAL/PLATELET
BASOS ABS: 0.1 10*3/uL (ref 0.0–0.1)
BASOS PCT: 0.6 % (ref 0.0–3.0)
EOS ABS: 0.3 10*3/uL (ref 0.0–0.7)
Eosinophils Relative: 3 % (ref 0.0–5.0)
HEMATOCRIT: 44.6 % (ref 39.0–52.0)
Hemoglobin: 15 g/dL (ref 13.0–17.0)
LYMPHS ABS: 2.2 10*3/uL (ref 0.7–4.0)
LYMPHS PCT: 26.6 % (ref 12.0–46.0)
MCHC: 33.6 g/dL (ref 30.0–36.0)
MCV: 93.7 fl (ref 78.0–100.0)
Monocytes Absolute: 0.6 10*3/uL (ref 0.1–1.0)
Monocytes Relative: 7.1 % (ref 3.0–12.0)
NEUTROS ABS: 5.2 10*3/uL (ref 1.4–7.7)
Neutrophils Relative %: 62.7 % (ref 43.0–77.0)
Platelets: 221 10*3/uL (ref 150.0–400.0)
RBC: 4.76 Mil/uL (ref 4.22–5.81)
RDW: 13.9 % (ref 11.5–14.6)
WBC: 8.4 10*3/uL (ref 4.5–10.5)

## 2013-05-23 LAB — TSH: TSH: 1.6 u[IU]/mL (ref 0.35–5.50)

## 2013-05-23 LAB — PSA: PSA: 0.38 ng/mL (ref 0.10–4.00)

## 2013-05-31 ENCOUNTER — Encounter: Payer: Self-pay | Admitting: Family Medicine

## 2013-05-31 ENCOUNTER — Ambulatory Visit (INDEPENDENT_AMBULATORY_CARE_PROVIDER_SITE_OTHER): Payer: 59 | Admitting: Family Medicine

## 2013-05-31 VITALS — BP 120/76 | HR 74 | Temp 98.4°F | Ht 73.0 in | Wt 250.0 lb

## 2013-05-31 DIAGNOSIS — Z0000 Encounter for general adult medical examination without abnormal findings: Secondary | ICD-10-CM

## 2013-05-31 NOTE — Progress Notes (Signed)
Subjective:    Patient ID: Carlos Michaels., male    DOB: [birth year], 53 y.o.   MRN: [id number]  HPI  Patient seen for complete physical. Quit smoking about 4 years ago. Recent chest x-ray showed probably some chronic interstitial lung disease and COPD changes. Respiratory status stable. He walks regularly for exercise. Has lost a few pounds and hopes to lose more. He has upcoming repeat colonoscopy. History of adenomatous polyps. History of mild hyperlipidemia.  Patient had perianal abscess with surgery secondary to fistula several months ago. He has done well since then with no recurrent problems. Tetanus is up-to-date.  Past Medical History  Diagnosis Date  . Anal fistula    Past Surgical History  Procedure Laterality Date  . Examination under anesthesia N/A 08/03/2012    Procedure: placement of seton drain;  Surgeon: Merrie Roof, MD;  Location: [facility];  Service: General;  Laterality: N/A;  placement of seton drain  . Orif left ankle fx  1981    HARDWARE REMOVED   . Excision forgein body and granuloma right index finger  07-18-2005  . Evaluation under anesthesia with anal fistulectomy N/A 12/09/2012    Procedure: EXAM UNDER ANESTHESIA, FISTULOTOMY;  Surgeon: Leighton Ruff, MD;  Location: [facility];  Service: General;  Laterality: N/A;    reports that he quit smoking about 3 years ago. His smoking use included Cigarettes. He has a 35 pack-year smoking history. He has never used smokeless tobacco. He reports that he drinks about 3.6 ounces of alcohol per week. He reports that he does not use illicit drugs. family history includes Cancer in his other and sister; Diabetes in his other; Heart disease in his mother; Mental illness in his other. Allergies  Allergen Reactions  . Adhesive [Tape] Other (See Comments)    Skin blister red  . Penicillins Rash      Review of Systems  Constitutional: Negative for fever, activity change, appetite change and fatigue.  HENT:  Negative for congestion, ear pain and trouble swallowing.   Eyes: Negative for pain and visual disturbance.  Respiratory: Negative for cough, shortness of breath and wheezing.   Cardiovascular: Negative for chest pain and palpitations.  Gastrointestinal: Negative for nausea, vomiting, abdominal pain, diarrhea, constipation, blood in stool, abdominal distention and rectal pain.  Genitourinary: Negative for dysuria, hematuria and testicular pain.  Musculoskeletal: Negative for arthralgias and joint swelling.  Skin: Negative for rash.  Neurological: Negative for dizziness, syncope and headaches.  Hematological: Negative for adenopathy.  Psychiatric/Behavioral: Negative for confusion and dysphoric mood.       Objective:   Physical Exam  Constitutional: He is oriented to person, place, and time. He appears well-developed and well-nourished. No distress.  HENT:  Head: Normocephalic and atraumatic.  Right Ear: External ear normal.  Left Ear: External ear normal.  Mouth/Throat: Oropharynx is clear and moist.  Eyes: Conjunctivae and EOM are normal. Pupils are equal, round, and reactive to light.  Neck: Normal range of motion. Neck supple. No thyromegaly present.  Cardiovascular: Normal rate, regular rhythm and normal heart sounds.   No murmur heard. Pulmonary/Chest: No respiratory distress. He has no wheezes. He has no rales.  Abdominal: Soft. Bowel sounds are normal. He exhibits no distension and no mass. There is no tenderness. There is no rebound and no guarding.  Musculoskeletal: He exhibits no edema.  Lymphadenopathy:    He has no cervical adenopathy.  Neurological: He is alert and oriented to person, place, and time. He  displays normal reflexes. No cranial nerve deficit.  Skin: No rash noted.  Psychiatric: He has a normal mood and affect.          Assessment & Plan:  Complete physical. Lipids discussed. His 10 year risk of CAD event is 5%. he's not interested in statin therapy  at this time. We recommend he lose some weight.  Repeat colonoscopy in June.

## 2013-05-31 NOTE — Patient Instructions (Signed)
Fat and Cholesterol Control Diet Fat and cholesterol levels in your blood and organs are influenced by your diet. High levels of fat and cholesterol may lead to diseases of the heart, small and large blood vessels, gallbladder, liver, and pancreas. CONTROLLING FAT AND CHOLESTEROL WITH DIET Although exercise and lifestyle factors are important, your diet is key. That is because certain foods are known to raise cholesterol and others to lower it. The goal is to balance foods for their effect on cholesterol and more importantly, to replace saturated and trans fat with other types of fat, such as monounsaturated fat, polyunsaturated fat, and omega-3 fatty acids. On average, a person should consume no more than 15 to 17 g of saturated fat daily. Saturated and trans fats are considered "bad" fats, and they will raise LDL cholesterol. Saturated fats are primarily found in animal products such as meats, butter, and cream. However, that does not mean you need to give up all your favorite foods. Today, there are good tasting, low-fat, low-cholesterol substitutes for most of the things you like to eat. Choose low-fat or nonfat alternatives. Choose round or loin cuts of red meat. These types of cuts are lowest in fat and cholesterol. Chicken (without the skin), fish, veal, and ground turkey breast are great choices. Eliminate fatty meats, such as hot dogs and salami. Even shellfish have little or no saturated fat. Have a 3 oz (85 g) portion when you eat lean meat, poultry, or fish. Trans fats are also called "partially hydrogenated oils." They are oils that have been scientifically manipulated so that they are solid at room temperature resulting in a longer shelf life and improved taste and texture of foods in which they are added. Trans fats are found in stick margarine, some tub margarines, cookies, crackers, and baked goods.  When baking and cooking, oils are a great substitute for butter. The monounsaturated oils are  especially beneficial since it is believed they lower LDL and raise HDL. The oils you should avoid entirely are saturated tropical oils, such as coconut and palm.  Remember to eat a lot from food groups that are naturally free of saturated and trans fat, including fish, fruit, vegetables, beans, grains (barley, rice, couscous, bulgur wheat), and pasta (without cream sauces).  IDENTIFYING FOODS THAT LOWER FAT AND CHOLESTEROL  Soluble fiber may lower your cholesterol. This type of fiber is found in fruits such as apples, vegetables such as broccoli, potatoes, and carrots, legumes such as beans, peas, and lentils, and grains such as barley. Foods fortified with plant sterols (phytosterol) may also lower cholesterol. You should eat at least 2 g per day of these foods for a cholesterol lowering effect.  Read package labels to identify low-saturated fats, trans fat free, and low-fat foods at the supermarket. Select cheeses that have only 2 to 3 g saturated fat per ounce. Use a heart-healthy tub margarine that is free of trans fats or partially hydrogenated oil. When buying baked goods (cookies, crackers), avoid partially hydrogenated oils. Breads and muffins should be made from whole grains (whole-wheat or whole oat flour, instead of "flour" or "enriched flour"). Buy non-creamy canned soups with reduced salt and no added fats.  FOOD PREPARATION TECHNIQUES  Never deep-fry. If you must fry, either stir-fry, which uses very little fat, or use non-stick cooking sprays. When possible, broil, bake, or roast meats, and steam vegetables. Instead of putting butter or margarine on vegetables, use lemon and herbs, applesauce, and cinnamon (for squash and sweet potatoes). Use nonfat   yogurt, salsa, and low-fat dressings for salads.  LOW-SATURATED FAT / LOW-FAT FOOD SUBSTITUTES Meats / Saturated Fat (g)  Avoid: Steak, marbled (3 oz/85 g) / 11 g  Choose: Steak, lean (3 oz/85 g) / 4 g  Avoid: Hamburger (3 oz/85 g) / 7  g  Choose: Hamburger, lean (3 oz/85 g) / 5 g  Avoid: Ham (3 oz/85 g) / 6 g  Choose: Ham, lean cut (3 oz/85 g) / 2.4 g  Avoid: Chicken, with skin, dark meat (3 oz/85 g) / 4 g  Choose: Chicken, skin removed, dark meat (3 oz/85 g) / 2 g  Avoid: Chicken, with skin, light meat (3 oz/85 g) / 2.5 g  Choose: Chicken, skin removed, light meat (3 oz/85 g) / 1 g Dairy / Saturated Fat (g)  Avoid: Whole milk (1 cup) / 5 g  Choose: Low-fat milk, 2% (1 cup) / 3 g  Choose: Low-fat milk, 1% (1 cup) / 1.5 g  Choose: Skim milk (1 cup) / 0.3 g  Avoid: Hard cheese (1 oz/28 g) / 6 g  Choose: Skim milk cheese (1 oz/28 g) / 2 to 3 g  Avoid: Cottage cheese, 4% fat (1 cup) / 6.5 g  Choose: Low-fat cottage cheese, 1% fat (1 cup) / 1.5 g  Avoid: Ice cream (1 cup) / 9 g  Choose: Sherbet (1 cup) / 2.5 g  Choose: Nonfat frozen yogurt (1 cup) / 0.3 g  Choose: Frozen fruit bar / trace  Avoid: Whipped cream (1 tbs) / 3.5 g  Choose: Nondairy whipped topping (1 tbs) / 1 g Condiments / Saturated Fat (g)  Avoid: Mayonnaise (1 tbs) / 2 g  Choose: Low-fat mayonnaise (1 tbs) / 1 g  Avoid: Butter (1 tbs) / 7 g  Choose: Extra light margarine (1 tbs) / 1 g  Avoid: Coconut oil (1 tbs) / 11.8 g  Choose: Olive oil (1 tbs) / 1.8 g  Choose: Corn oil (1 tbs) / 1.7 g  Choose: Safflower oil (1 tbs) / 1.2 g  Choose: Sunflower oil (1 tbs) / 1.4 g  Choose: Soybean oil (1 tbs) / 2.4 g  Choose: Canola oil (1 tbs) / 1 g Document Released: 01/06/2005 Document Revised: 05/03/2012 Document Reviewed: 06/27/2010 Exit[employer] Patient Information 2014 [place], [place].  Try to lose some more weight.

## 2013-05-31 NOTE — Progress Notes (Signed)
Pre visit review using our clinic review tool, if applicable. No additional management support is needed unless otherwise documented below in the visit note. 

## 2013-06-09 ENCOUNTER — Ambulatory Visit (AMBULATORY_SURGERY_CENTER): Payer: Self-pay | Admitting: *Deleted

## 2013-06-09 VITALS — Ht 73.0 in | Wt 252.4 lb

## 2013-06-09 DIAGNOSIS — Z86010 Personal history of colonic polyps: Secondary | ICD-10-CM

## 2013-06-09 MED ORDER — NA SULFATE-K SULFATE-MG SULF 17.5-3.13-1.6 GM/177ML PO SOLN: 1.0000 | Freq: Once | ORAL | Status: DC

## 2013-06-09 NOTE — Progress Notes (Signed)
Denies allergies to eggs or soy products. Denies complications with sedation or anesthesia. Denies O2 use. Denies use of diet or weight loss medications.  Emmi instructions given for colonoscopy.  

## 2013-06-17 ENCOUNTER — Encounter: Payer: Self-pay | Admitting: Gastroenterology

## 2013-06-23 ENCOUNTER — Encounter: Payer: Self-pay | Admitting: Gastroenterology

## 2013-06-23 ENCOUNTER — Ambulatory Visit (AMBULATORY_SURGERY_CENTER): Payer: 59 | Admitting: Gastroenterology

## 2013-06-23 VITALS — BP 123/74 | HR 61 | Temp 96.4°F | Resp 18 | Ht 73.0 in | Wt 252.0 lb

## 2013-06-23 DIAGNOSIS — K5730 Diverticulosis of large intestine without perforation or abscess without bleeding: Secondary | ICD-10-CM

## 2013-06-23 DIAGNOSIS — D126 Benign neoplasm of colon, unspecified: Secondary | ICD-10-CM

## 2013-06-23 DIAGNOSIS — Z86010 Personal history of colonic polyps: Secondary | ICD-10-CM

## 2013-06-23 MED ORDER — SODIUM CHLORIDE 0.9 % IV SOLN: 500.0000 mL | INTRAVENOUS | Status: DC

## 2013-06-23 NOTE — Patient Instructions (Signed)
YOU HAD AN ENDOSCOPIC PROCEDURE TODAY AT [facility]: Refer to the procedure report that was given to you for any specific questions about what was found during the examination.  If the procedure report does not answer your questions, please call your gastroenterologist to clarify.  If you requested that your care partner not be given the details of your procedure findings, then the procedure report has been included in a sealed envelope for you to review at your convenience later.  YOU SHOULD EXPECT: Some feelings of bloating in the abdomen. Passage of more gas than usual.  Walking can help get rid of the air that was put into your GI tract during the procedure and reduce the bloating. If you had a lower endoscopy (such as a colonoscopy or flexible sigmoidoscopy) you may notice spotting of blood in your stool or on the toilet paper. If you underwent a bowel prep for your procedure, then you may not have a normal bowel movement for a few days.  DIET: Your first meal following the procedure should be a light meal and then it is ok to progress to your normal diet.  A half-sandwich or bowl of soup is an example of a good first meal.  Heavy or fried foods are harder to digest and may make you feel nauseous or bloated.  Likewise meals heavy in dairy and vegetables can cause extra gas to form and this can also increase the bloating.  Drink plenty of fluids but you should avoid alcoholic beverages for 24 hours.  ACTIVITY: Your care partner should take you home directly after the procedure.  You should plan to take it easy, moving slowly for the rest of the day.  You can resume normal activity the day after the procedure however you should NOT DRIVE or use heavy machinery for 24 hours (because of the sedation medicines used during the test).    SYMPTOMS TO REPORT IMMEDIATELY: A gastroenterologist can be reached at any hour.  During normal business hours, 8:30 AM to 5:00 PM Monday through Friday,  call [phone].  After hours and on weekends, please call the GI answering service at [phone] who will take a message and have the physician on call contact you.   Following lower endoscopy (colonoscopy or flexible sigmoidoscopy):  Excessive amounts of blood in the stool  Significant tenderness or worsening of abdominal pains  Swelling of the abdomen that is new, acute  FOLLOW UP: If any biopsies were taken you will be contacted by phone or by letter within the next 1-3 weeks.  Call your gastroenterologist if you have not heard about the biopsies in 3 weeks.  Our staff will call the home number listed on your records the next business day following your procedure to check on you and address any questions or concerns that you may have at that time regarding the information given to you following your procedure. This is a courtesy call and so if there is no answer at the home number and we have not heard from you through the emergency physician on call, we will assume that you have returned to your regular daily activities without incident.  SIGNATURES/CONFIDENTIALITY: You and/or your care partner have signed paperwork which will be entered into your electronic medical record.  These signatures attest to the fact that that the information above on your After Visit Summary has been reviewed and is understood.  Full responsibility of the confidentiality of this discharge information lies with you and/or  your care-partner. 

## 2013-06-23 NOTE — Progress Notes (Signed)
A/ox3, pleased with MAC, report to RN 

## 2013-06-23 NOTE — Op Note (Signed)
[facility]  [address]. [place], [address]   COLONOSCOPY PROCEDURE REPORT  PATIENT: [name], [name].  MR#: [id number] BIRTHDATE: [birth year] , 73  yrs. old GENDER: Male ENDOSCOPIST: Inda [facility], MD REFERRED VZ:CHYIF Elease Hashimoto, M.D. PROCEDURE DATE:  06/23/2013 PROCEDURE:   Colonoscopy with snare polypectomy First Screening Colonoscopy - Avg.  risk and is 50 yrs.  old or older - No.  Prior Negative Screening - Now for repeat screening. N/A  History of Adenoma - Now for follow-up colonoscopy & has been > or = to 3 yrs.  Yes hx of adenoma.  Has been 3 or more years since last colonoscopy.  Polyps Removed Today? Yes. ASA CLASS:   Class II INDICATIONS:Patient's personal history of multiple colon polyps. 201[address] MEDICATIONS: MAC sedation, administered by CRNA and Propofol (Diprivan) 240 mg IV  DESCRIPTION OF PROCEDURE:   After the risks benefits and alternatives of the procedure were thoroughly explained, informed consent was obtained.  A digital rectal exam revealed no abnormalities of the rectum.   The LB [id number]  endoscope was introduced through the anus and advanced to the cecum, which was identified by both the appendix and ileocecal valve. No adverse events experienced.   The quality of the prep was Suprep good  The instrument was then slowly withdrawn as the colon was fully examined.      COLON FINDINGS: A flat polyp measuring 10 mm in size was found at the cecum.  A polypectomy was performed with a cold snare.  The resection was complete and the polyp tissue was completely retrieved.   Moderate diverticulosis was noted in the sigmoid colon.   The colon was otherwise normal.  There was no diverticulosis, inflammation, polyps or cancers unless previously stated.  Retroflexed views revealed no abnormalities. The time to cecum=2 minutes 35 seconds.  Withdrawal time=9 minutes 9.01 seconds.  The scope was withdrawn and the procedure  completed. COMPLICATIONS: There were no complications.  ENDOSCOPIC IMPRESSION: 1.   Flat polyp measuring 10 mm in size was found at the cecum; polypectomy was performed with a cold snare 2.   Moderate diverticulosis was noted in the sigmoid colon 3.   The colon was otherwise normal  RECOMMENDATIONS: If the polyp(s) removed today are proven to be adenomatous (pre-cancerous) polyps, you will need a colonoscopy in 3 years. Otherwise you should followup with colonoscopy in 7 years.  You will receive a letter within 1-2 weeks with the results of your biopsy as well as final recommendations.  Please call my office if you have not received a letter after 3 weeks.   eSigned:  Inda [facility], MD 06/23/2013 2:44 PM   cc:   PATIENT NAME:  [name]. MR#: [id number]

## 2013-06-23 NOTE — Progress Notes (Signed)
Called to room to assist during endoscopic procedure.  Patient ID and intended procedure confirmed with present staff. Received instructions for my participation in the procedure from the performing physician.  

## 2013-06-24 ENCOUNTER — Telehealth: Payer: Self-pay

## 2013-06-24 NOTE — Telephone Encounter (Signed)
  Follow up Call-  Call back number 06/23/2013  Post procedure Call Back phone  # [phone]  Permission to leave phone message Yes     Patient questions:  Do you have a fever, pain , or abdominal swelling? no Pain Score  0 *  Have you tolerated food without any problems? yes  Have you been able to return to your normal activities? yes  Do you have any questions about your discharge instructions: Diet   no Medications  no Follow up visit  no  Do you have questions or concerns about your Care? no  Actions: * If pain score is 4 or above: No action needed, pain <4.

## 2013-06-29 ENCOUNTER — Encounter: Payer: Self-pay | Admitting: Gastroenterology

## 2013-08-10 ENCOUNTER — Other Ambulatory Visit: Payer: Self-pay | Admitting: Family Medicine

## 2013-08-10 NOTE — Telephone Encounter (Signed)
Refill OK

## 2013-08-10 NOTE — Telephone Encounter (Signed)
Last visit 05/31/13  Last refill 12/08/12 #30 1 refill

## 2013-10-20 ENCOUNTER — Other Ambulatory Visit: Payer: Self-pay | Admitting: Family Medicine

## 2013-10-20 NOTE — Telephone Encounter (Signed)
Last visit 05/31/13 Last refill 08/10/13 #30 0 refill

## 2013-10-21 NOTE — Telephone Encounter (Signed)
Refill OK

## 2013-11-07 ENCOUNTER — Ambulatory Visit (INDEPENDENT_AMBULATORY_CARE_PROVIDER_SITE_OTHER): Payer: 59 | Admitting: Family Medicine

## 2013-11-07 DIAGNOSIS — Z23 Encounter for immunization: Secondary | ICD-10-CM

## 2014-01-05 ENCOUNTER — Telehealth: Payer: Self-pay | Admitting: Family Medicine

## 2014-01-05 MED ORDER — ZOLPIDEM TARTRATE 10 MG PO TABS: ORAL_TABLET | ORAL | Status: DC

## 2014-01-05 NOTE — Telephone Encounter (Signed)
LAst visit 05/31/13 Last refill 10/21/13 #30 0 refill

## 2014-01-05 NOTE — Telephone Encounter (Signed)
Rx called in to pharmacy. 

## 2014-01-05 NOTE — Telephone Encounter (Signed)
Refill once.  Try to avoid regular use. 

## 2014-01-05 NOTE — Telephone Encounter (Signed)
Pt needs refill on ambien 10 mg #30 w/refills call into [facility] [place]

## 2014-03-17 ENCOUNTER — Other Ambulatory Visit: Payer: Self-pay | Admitting: Family Medicine

## 2014-03-17 NOTE — Telephone Encounter (Signed)
Last visit 05/31/13 Last refill 01/05/14 #30 0 refill

## 2014-03-19 NOTE — Telephone Encounter (Signed)
Refill OK

## 2014-03-20 NOTE — Telephone Encounter (Signed)
Already answered.  Refill OK.

## 2014-04-24 ENCOUNTER — Other Ambulatory Visit: Payer: Self-pay

## 2014-04-26 ENCOUNTER — Other Ambulatory Visit (INDEPENDENT_AMBULATORY_CARE_PROVIDER_SITE_OTHER): Payer: 59

## 2014-04-26 DIAGNOSIS — Z0000 Encounter for general adult medical examination without abnormal findings: Secondary | ICD-10-CM

## 2014-04-26 LAB — TSH: TSH: 2.11 u[IU]/mL (ref 0.35–4.50)

## 2014-04-26 LAB — CBC WITH DIFFERENTIAL/PLATELET
Basophils Absolute: 0 10*3/uL (ref 0.0–0.1)
Basophils Relative: 0.6 % (ref 0.0–3.0)
EOS PCT: 3.4 % (ref 0.0–5.0)
Eosinophils Absolute: 0.3 10*3/uL (ref 0.0–0.7)
HEMATOCRIT: 43.1 % (ref 39.0–52.0)
Hemoglobin: 14.6 g/dL (ref 13.0–17.0)
LYMPHS ABS: 2.2 10*3/uL (ref 0.7–4.0)
Lymphocytes Relative: 27.6 % (ref 12.0–46.0)
MCHC: 34 g/dL (ref 30.0–36.0)
MCV: 90.1 fl (ref 78.0–100.0)
Monocytes Absolute: 0.5 10*3/uL (ref 0.1–1.0)
Monocytes Relative: 6.4 % (ref 3.0–12.0)
NEUTROS ABS: 4.9 10*3/uL (ref 1.4–7.7)
Neutrophils Relative %: 62 % (ref 43.0–77.0)
Platelets: 211 10*3/uL (ref 150.0–400.0)
RBC: 4.79 Mil/uL (ref 4.22–5.81)
RDW: 14.1 % (ref 11.5–15.5)
WBC: 7.9 10*3/uL (ref 4.0–10.5)

## 2014-04-26 LAB — HEPATIC FUNCTION PANEL
ALT: 22 U/L (ref 0–53)
AST: 18 U/L (ref 0–37)
Albumin: 3.9 g/dL (ref 3.5–5.2)
Alkaline Phosphatase: 67 U/L (ref 39–117)
BILIRUBIN DIRECT: 0.1 mg/dL (ref 0.0–0.3)
TOTAL PROTEIN: 6.6 g/dL (ref 6.0–8.3)
Total Bilirubin: 0.4 mg/dL (ref 0.2–1.2)

## 2014-04-26 LAB — BASIC METABOLIC PANEL
BUN: 16 mg/dL (ref 6–23)
CHLORIDE: 105 meq/L (ref 96–112)
CO2: 30 mEq/L (ref 19–32)
Calcium: 9.3 mg/dL (ref 8.4–10.5)
Creatinine, Ser: 1.04 mg/dL (ref 0.40–1.50)
GFR: 79.05 mL/min (ref >60.00)
Glucose, Bld: 98 mg/dL (ref 70–99)
POTASSIUM: 4.9 meq/L (ref 3.5–5.1)
SODIUM: 140 meq/L (ref 135–145)

## 2014-04-26 LAB — LIPID PANEL
CHOL/HDL RATIO: 4
Cholesterol: 182 mg/dL (ref 0–200)
HDL: 43.3 mg/dL (ref >39.00)
LDL Cholesterol: 122 mg/dL — ABNORMAL HIGH (ref 0–99)
NonHDL: 138.7
Triglycerides: 82 mg/dL (ref 0.0–149.0)
VLDL: 16.4 mg/dL (ref 0.0–40.0)

## 2014-04-26 LAB — PSA: PSA: 0.32 ng/mL (ref 0.10–4.00)

## 2014-05-02 ENCOUNTER — Ambulatory Visit (INDEPENDENT_AMBULATORY_CARE_PROVIDER_SITE_OTHER): Payer: 59 | Admitting: Family Medicine

## 2014-05-02 ENCOUNTER — Encounter: Payer: Self-pay | Admitting: Family Medicine

## 2014-05-02 VITALS — BP 122/74 | HR 71 | Temp 98.2°F | Ht 73.0 in | Wt 246.0 lb

## 2014-05-02 DIAGNOSIS — Z0000 Encounter for general adult medical examination without abnormal findings: Secondary | ICD-10-CM | POA: Diagnosis not present

## 2014-05-02 NOTE — Progress Notes (Signed)
Subjective:    Patient ID: Carlos Michaels., male    DOB: [birth year], 54 y.o.   MRN: [id number]  HPI   Patient here for complete physical. Quit smoking about 4 years ago. He has some chronic insomnia and takes low-dose Ambien as needed. No other medications. Tetanus up-to-date. He had colonoscopy last year which showed adenomatous polyp. Will need follow-up colonoscopy in 2 years. He's lost a few pounds this year due to his efforts. 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 Overall feels well.  Past Medical History  Diagnosis Date  . Anal fistula   . Seasonal allergies   . COPD, mild    Past Surgical History  Procedure Laterality Date  . Examination under anesthesia N/A 08/03/2012    Procedure: placement of seton drain;  Surgeon: Merrie Roof, MD;  Location: [facility];  Service: General;  Laterality: N/A;  placement of seton drain  . Orif left ankle fx  1981    HARDWARE REMOVED   . Excision forgein body and granuloma right index finger  07-18-2005  . Evaluation under anesthesia with anal fistulectomy N/A 12/09/2012    Procedure: EXAM UNDER ANESTHESIA, FISTULOTOMY;  Surgeon: Leighton Ruff, MD;  Location: [facility];  Service: General;  Laterality: N/A;    reports that he quit smoking about 4 years ago. His smoking use included Cigarettes. He has a 35 pack-year smoking history. He has never used smokeless tobacco. He reports that he drinks about 3.6 oz of alcohol per week. He reports that he does not use illicit drugs. family history includes Cancer in his other and sister; Diabetes in his other; Heart disease in his mother; Mental illness in his other. There is no history of Colon cancer, Esophageal cancer, Rectal cancer, or Stomach cancer. Allergies  Allergen Reactions  . Adhesive [Tape] Other (See Comments)    Skin blister red  . Penicillins Rash      Review of Systems  Constitutional: Negative  for fever, activity change, appetite change and fatigue.  HENT: Negative for congestion, ear pain and trouble swallowing.   Eyes: Negative for pain and visual disturbance.  Respiratory: Negative for cough, shortness of breath and wheezing.   Cardiovascular: Negative for chest pain and palpitations.  Gastrointestinal: Negative for nausea, vomiting, abdominal pain, diarrhea, constipation, blood in stool, abdominal distention and rectal pain.  Genitourinary: Negative for dysuria, hematuria and testicular pain.  Musculoskeletal: Negative for joint swelling and arthralgias.  Skin: Negative for rash.  Neurological: Negative for dizziness, syncope and headaches.  Hematological: Negative for adenopathy.  Psychiatric/Behavioral: Negative for confusion and dysphoric mood.       Objective:   Physical Exam  Constitutional: He is oriented to person, place, and time. He appears well-developed and well-nourished. No distress.  HENT:  Head: Normocephalic and atraumatic.  Right Ear: External ear normal.  Left Ear: External ear normal.  Mouth/Throat: Oropharynx is clear and moist.  Eyes: Conjunctivae and EOM are normal. Pupils are equal, round, and reactive to light.  Neck: Normal range of motion. Neck supple. No thyromegaly present.  Cardiovascular: Normal rate, regular rhythm and normal heart sounds.   No murmur heard. Pulmonary/Chest: No respiratory distress. He has no wheezes. He has no rales.  Abdominal: Soft. Bowel sounds are normal. He exhibits no distension and no mass. There is no tenderness. There is no rebound and no guarding.  Musculoskeletal: He exhibits no edema.  Lymphadenopathy:    He has no cervical adenopathy.  Neurological: He is alert and oriented to person, place, and time. He displays normal reflexes. No cranial nerve deficit.  Skin: No rash noted.  Psychiatric: He has a normal mood and affect.          Assessment & Plan:  Complete physical. Labs reviewed. No major  concerns. Tetanus up-to-date. Colonoscopy up-to-date. Continue weight loss efforts.

## 2014-05-02 NOTE — Progress Notes (Signed)
Pre visit review using our clinic review tool, if applicable. No additional management support is needed unless otherwise documented below in the visit note. 

## 2014-05-31 ENCOUNTER — Other Ambulatory Visit: Payer: Self-pay | Admitting: Family Medicine

## 2014-06-01 NOTE — Telephone Encounter (Signed)
Refill OK

## 2014-06-01 NOTE — Telephone Encounter (Signed)
Last visit 05/02/14 Last refill 03/20/14 #30 0 refill

## 2014-07-27 ENCOUNTER — Encounter: Payer: Self-pay | Admitting: Family Medicine

## 2014-07-27 ENCOUNTER — Ambulatory Visit (INDEPENDENT_AMBULATORY_CARE_PROVIDER_SITE_OTHER): Payer: 59 | Admitting: Family Medicine

## 2014-07-27 VITALS — BP 120/80 | HR 72 | Temp 98.0°F | Wt 247.0 lb

## 2014-07-27 DIAGNOSIS — M7021 Olecranon bursitis, right elbow: Secondary | ICD-10-CM | POA: Diagnosis not present

## 2014-07-27 DIAGNOSIS — G4700 Insomnia, unspecified: Secondary | ICD-10-CM

## 2014-07-27 MED ORDER — ZOLPIDEM TARTRATE 10 MG PO TABS: 10.0000 mg | ORAL_TABLET | Freq: Every evening | ORAL | Status: DC | PRN

## 2014-07-27 NOTE — Progress Notes (Signed)
   Subjective:    Patient ID: [name]., male    DOB: Apr 20, 1960, 54 y.o.   MRN: [id number]  HPI Right elbow swelling. Present for about one month. No specific injury. No associated pain. No erythema or warmth. No history of similar problem. Swelling has been persistent. Full range of motion elbow.  History of insomnia. Uses Ambien one half tablet daily at bedtime occasionally. Requesting refill. No consistent alcohol use. No late day caffeine use.   Review of Systems  Constitutional: Negative for fever and chills.  Neurological: Negative for weakness.       Objective:   Physical Exam  Constitutional: He appears well-developed and well-nourished.  Cardiovascular: Normal rate and regular rhythm.   Pulmonary/Chest: Effort normal and breath sounds normal. No respiratory distress. He has no wheezes. He has no rales.  Musculoskeletal:  Right elbow full range of motion. He has swelling of the olecranon bursa. No erythema. No warmth.          Assessment & Plan:  #1 right olecranon bursitis. No evidence for cellulitis or inflammation. Patient requesting drainage. We discussed the fact that these frequently recur even with drainage. Skin prepped with Betadine. Using a #23-gauge one-inch needle removed 12 mL of slightly serosanguineous drainage. Patient tolerated well. Pressure wrap with Coban and he is aware this may recur #2 chronic insomnia. Refill Ambien for as needed use. Sleep hygiene discussed.

## 2014-07-27 NOTE — Patient Instructions (Signed)
Olecranon Bursitis Bursitis is swelling and soreness (inflammation) of a fluid-filled sac (bursa) that covers and protects a joint. Olecranon bursitis occurs over the elbow.  CAUSES Bursitis can be caused by injury, overuse of the joint, arthritis, or infection.  SYMPTOMS   Tenderness, swelling, warmth, or redness over the elbow.  Elbow pain with movement. This is greater with bending the elbow.  Squeaking sound when the bursa is rubbed or moved.  Increasing size of the bursa without pain or discomfort.  Fever with increasing pain and swelling if the bursa becomes infected. HOME CARE INSTRUCTIONS   Put ice on the affected area.  Put ice in a plastic bag.  Place a towel between your skin and the bag.  Leave the ice on for 15-20 minutes each hour while awake. Do this for the first 2 days.  When resting, elevate your elbow above the level of your heart. This helps reduce swelling.  Continue to put the joint through a full range of motion 4 times per day. Rest the injured joint at other times. When the pain lessens, begin normal slow movements and usual activities.  Only take over-the-counter or prescription medicines for pain, discomfort, or fever as directed by your caregiver.  Reduce your intake of milk and related dairy products (cheese, yogurt). They may make your condition worse. SEEK IMMEDIATE MEDICAL CARE IF:   Your pain increases even during treatment.  You have a fever.  You have heat and inflammation over the bursa and elbow.  You have a red line that goes up your arm.  You have pain with movement of your elbow. MAKE SURE YOU:   Understand these instructions.  Will watch your condition.  Will get help right away if you are not doing well or get worse. Document Released: 02/05/2006 Document Revised: 03/31/2011 Document Reviewed: 12/22/2006 ExitCare Patient Information 2015 [employer]. This information is not intended to replace advice given to you by your  health care provider. Make sure you discuss any questions you have with your health care provider.  

## 2014-07-27 NOTE — Progress Notes (Signed)
Pre visit review using our clinic review tool, if applicable. No additional management support is needed unless otherwise documented below in the visit note. 

## 2014-11-21 ENCOUNTER — Ambulatory Visit (INDEPENDENT_AMBULATORY_CARE_PROVIDER_SITE_OTHER): Payer: 59

## 2014-11-21 DIAGNOSIS — Z23 Encounter for immunization: Secondary | ICD-10-CM | POA: Diagnosis not present

## 2015-02-08 ENCOUNTER — Encounter: Payer: Self-pay | Admitting: Family Medicine

## 2015-02-08 ENCOUNTER — Ambulatory Visit (INDEPENDENT_AMBULATORY_CARE_PROVIDER_SITE_OTHER): Payer: Managed Care, Other (non HMO) | Admitting: Family Medicine

## 2015-02-08 VITALS — BP 100/74 | HR 79 | Temp 98.0°F | Ht 73.0 in | Wt 249.0 lb

## 2015-02-08 DIAGNOSIS — Z7189 Other specified counseling: Secondary | ICD-10-CM

## 2015-02-08 DIAGNOSIS — R200 Anesthesia of skin: Secondary | ICD-10-CM

## 2015-02-08 DIAGNOSIS — R208 Other disturbances of skin sensation: Secondary | ICD-10-CM | POA: Diagnosis not present

## 2015-02-08 DIAGNOSIS — Z7184 Encounter for health counseling related to travel: Secondary | ICD-10-CM

## 2015-02-08 MED ORDER — CIPROFLOXACIN HCL 500 MG PO TABS: ORAL_TABLET | ORAL | Status: DC

## 2015-02-08 NOTE — Progress Notes (Signed)
Pre visit review using our clinic review tool, if applicable. No additional management support is needed unless otherwise documented below in the visit note. 

## 2015-02-08 NOTE — Progress Notes (Signed)
Subjective:    Patient ID: [name]., male    DOB: 1960/10/31, 55 y.o.   MRN: [id number]  HPI Patient seen for the following issues  Left arm numbness. He describes 2 week history of somewhat intermittent but mostly constant mild numbness which radiates from his left shoulder region all the way down to his fingers and he is mostly aware of this involving the reading and index finger. He denies a neck pain. No arm pain. No weakness. No lower extremity symptoms. No headaches. No confusion. No slurred speech. No color changes in his extremity.  No exacerbating or alleviating factors. This is not appear to be positional. His job does fair amount of lifting but he denies any specific injury.  Second issue is upcoming travel to [place] in April. Has had previous had a vaccine and typhoid vaccine 2014 which is oral typhoid. Tetanus up-to-date. No indication for malaria prevention in the region he is going to  Past Medical History  Diagnosis Date  . Anal fistula   . Seasonal allergies   . COPD, mild [facility])    Past Surgical History  Procedure Laterality Date  . Examination under anesthesia N/A 08/03/2012    Procedure: placement of seton drain;  Surgeon: Merrie Roof, MD;  Location: [place];  Service: General;  Laterality: N/A;  placement of seton drain  . Orif left ankle fx  1981    HARDWARE REMOVED   . Excision forgein body and granuloma right index finger  07-18-2005  . Evaluation under anesthesia with anal fistulectomy N/A 12/09/2012    Procedure: EXAM UNDER ANESTHESIA, FISTULOTOMY;  Surgeon: Leighton Ruff, MD;  Location: [place];  Service: General;  Laterality: N/A;    reports that he quit smoking about 5 years ago. His smoking use included Cigarettes. He has a 35 pack-year smoking history. He has never used smokeless tobacco. He reports that he drinks about 3.6 oz of alcohol per week. He reports that he does not use illicit drugs. family history includes Cancer in  his other and sister; Diabetes in his other; Heart disease in his mother; Mental illness in his other. There is no history of Colon cancer, Esophageal cancer, Rectal cancer, or Stomach cancer. Allergies  Allergen Reactions  . Adhesive [Tape] Other (See Comments)    Skin blister red  . Penicillins Rash      Review of Systems  Constitutional: Negative for fatigue.  Eyes: Negative for visual disturbance.  Respiratory: Negative for cough, chest tightness and shortness of breath.   Cardiovascular: Negative for chest pain, palpitations and leg swelling.  Musculoskeletal: Negative for neck pain and neck stiffness.  Neurological: Positive for numbness. Negative for dizziness, syncope, weakness, light-headedness and headaches.       Objective:   Physical Exam  Constitutional: He is oriented to person, place, and time. He appears well-developed and well-nourished.  HENT:  Right Ear: External ear normal.  Left Ear: External ear normal.  Mouth/Throat: Oropharynx is clear and moist.  Eyes: Pupils are equal, round, and reactive to light.  Neck: Neck supple. No thyromegaly present.  No carotid bruits  Cardiovascular: Normal rate and regular rhythm.   Pulmonary/Chest: Effort normal and breath sounds normal. No respiratory distress. He has no wheezes. He has no rales.  Musculoskeletal: He exhibits no edema.  Excellent distal pulses in both arms Both hands are warm to touch with excellent capillary refill  Neurological: He is alert and oriented to person, place, and time.  Full strength upper extremities. Symmetric reflexes. Minimal decrease monofilament testing left index but we cannot determine deficiency in any other fingers or hand          Assessment & Plan:  #1  2 week history of left upper extremity numbness. He does not have any associated weakness. Symptoms did not sound suspicious for carpal tunnel. Doubt central origin. He does not describe any cervical neck pains consistently  but cervical nerve impingement would seem most likely. We discussed further evaluation with possible cervical MRI. Will start with nerve conduction studies since his symptoms are very vague and hard to localize.  #2 travel advice encounter. Immunizations are up-to-date. No indication for malaria prevention for region he is traveling to. We gave him advice regarding protection of food and water sources. Prescription for Cipro as needed for traveler's diarrhea

## 2015-02-13 ENCOUNTER — Other Ambulatory Visit: Payer: Self-pay | Admitting: Family Medicine

## 2015-02-15 ENCOUNTER — Telehealth: Payer: Self-pay | Admitting: Family Medicine

## 2015-02-15 DIAGNOSIS — R200 Anesthesia of skin: Secondary | ICD-10-CM

## 2015-02-15 NOTE — Telephone Encounter (Signed)
Can you check on this for me please

## 2015-02-15 NOTE — Telephone Encounter (Signed)
Please review. Looks like you wanted pt to have nerve conduction studies done. Is this done at Neuro if so okay for order?

## 2015-02-15 NOTE — Telephone Encounter (Signed)
We had placed order- not to neuro but for nerve sensory testing.  Let's make sure order is in for NCV (nerve conduction velocity) with EMG.

## 2015-02-15 NOTE — Telephone Encounter (Signed)
Pt seen 1/19 and states dr Elease Hashimoto was refer to neurologist for numbness in arm. Pt had not heard anything . Was referral done? Please advsie

## 2015-02-16 NOTE — Telephone Encounter (Signed)
Order placed

## 2015-02-27 ENCOUNTER — Ambulatory Visit (INDEPENDENT_AMBULATORY_CARE_PROVIDER_SITE_OTHER): Payer: PRIVATE HEALTH INSURANCE | Admitting: Neurology

## 2015-02-27 DIAGNOSIS — R208 Other disturbances of skin sensation: Secondary | ICD-10-CM

## 2015-02-27 DIAGNOSIS — R200 Anesthesia of skin: Secondary | ICD-10-CM

## 2015-02-27 DIAGNOSIS — G5602 Carpal tunnel syndrome, left upper limb: Secondary | ICD-10-CM

## 2015-02-27 NOTE — Procedures (Signed)
[facility]  [address]  [place], [place] [address] Tel: [phone] Fax:  [phone] Test Date:  02/27/2015  Patient: [name] DOB: 08/22/1960 Physician: Narda Amber, DO  Sex: Male Height: 6\' 1"  Ref Phys: Dr Elease Hashimoto  ID#: [id number] Temp: 33.2C Technician: Jerilynn Mages. Dean   Patient Complaints: This is a 55 year old gentleman referred for evaluation of left hand paresthesias.  NCV & EMG Findings: Extensive electrodiagnostic testing of the left upper extremity shows: 1. Left median and ulnar sensory responses are within normal limits. Left median palmar responses are abnormal. 2. Left median and ulnar motor responses are within normal limits. 3. There is no evidence of active or chronic motor axon loss changes affecting any of the tested muscles. Motor unit configuration and recruitment pattern is within normal limits.  Impression: 1. Left median neuropathy at or distal to the wrist, consistent with the clinical diagnosis of carpal tunnel syndrome. Overall, these findings are mild in degree electrically. 2. There is no evidence of a cervical radiculopathy affecting the left upper extremity.   ___________________________ Narda Amber, DO    Nerve Conduction Studies Anti Sensory Summary Table   Site NR Peak (ms) Norm Peak (ms) P-T Amp (V) Norm P-T Amp  Left Median Anti Sensory (2nd Digit)  Wrist    3.6 <3.6 18.7 >15  Left Ulnar Anti Sensory (5th Digit)    Used long distance  Wrist    3.3 <3.1 14.1 >10   Motor Summary Table   Site NR Onset (ms) Norm Onset (ms) O-P Amp (mV) Norm O-P Amp Site1 Site2 Delta-0 (ms) Dist (cm) Vel (m/s) Norm Vel (m/s)  Left Median Motor (Abd Poll Brev)  Wrist    3.8 <4.0 7.8 >6 Elbow Wrist 5.8 32.0 55 >50  Elbow    9.6  7.6         Left Ulnar Motor (Abd Dig Minimi)  Wrist    3.1 <3.1 10.2 >7 B Elbow Wrist 4.1 25.0 61 >50  B Elbow    7.2  9.4  A Elbow B Elbow 1.5 10.0 67 >50  A Elbow    8.7  8.4          Comparison  Summary Table   Site NR Peak (ms) Norm Peak (ms) P-T Amp (V) Site1 Site2 Delta-P (ms) Norm Delta (ms)  Left Median/Ulnar Palm Comparison (Wrist - 8cm)  Median Palm    2.3 <2.2 43.4 Median Palm Ulnar Palm 0.3   Ulnar Palm    2.0 <2.2 10.3       EMG   Side Muscle Ins Act Fibs Psw Fasc Number Recrt Dur Dur. Amp Amp. Poly Poly. Comment  Left 1stDorInt Nml Nml Nml Nml Nml Nml Nml Nml Nml Nml Nml Nml N/A  Left Abd Poll Brev Nml Nml Nml Nml Nml Nml Nml Nml Nml Nml Nml Nml N/A  Left Ext Indicis Nml Nml Nml Nml Nml Nml Nml Nml Nml Nml Nml Nml N/A  Left PronatorTeres Nml Nml Nml Nml Nml Nml Nml Nml Nml Nml Nml Nml N/A  Left Biceps Nml Nml Nml Nml Nml Nml Nml Nml Nml Nml Nml Nml N/A  Left Triceps Nml Nml Nml Nml Nml Nml Nml Nml Nml Nml Nml Nml N/A  Left Deltoid Nml Nml Nml Nml Nml Nml Nml Nml Nml Nml Nml Nml N/A      Waveforms:

## 2015-06-20 ENCOUNTER — Other Ambulatory Visit: Payer: Self-pay | Admitting: Family Medicine

## 2015-08-20 ENCOUNTER — Other Ambulatory Visit: Payer: Self-pay | Admitting: Family Medicine

## 2015-08-20 NOTE — Telephone Encounter (Signed)
Pt is requesting a refill, last filled on 6/01. Last OV 02/08/15, ok to refill?

## 2015-08-20 NOTE — Telephone Encounter (Signed)
Refill once -avoid regular use 

## 2015-08-21 ENCOUNTER — Other Ambulatory Visit: Payer: Self-pay

## 2015-08-21 NOTE — Telephone Encounter (Signed)
[name] was called in to [facility].

## 2015-10-23 ENCOUNTER — Other Ambulatory Visit: Payer: Self-pay | Admitting: Family Medicine

## 2015-10-23 NOTE — Telephone Encounter (Signed)
Refill once.  Would try to avoid regular use.

## 2015-10-23 NOTE — Telephone Encounter (Signed)
Last refill 08/21/2015 #30 Last OV 02-08-2015  Please advise

## 2015-10-30 ENCOUNTER — Ambulatory Visit (INDEPENDENT_AMBULATORY_CARE_PROVIDER_SITE_OTHER): Payer: PRIVATE HEALTH INSURANCE | Admitting: Family Medicine

## 2015-10-30 DIAGNOSIS — Z23 Encounter for immunization: Secondary | ICD-10-CM

## 2015-12-20 ENCOUNTER — Other Ambulatory Visit: Payer: Self-pay | Admitting: Family Medicine

## 2016-01-21 HISTORY — PX: COLONOSCOPY: SHX174

## 2016-02-20 ENCOUNTER — Other Ambulatory Visit: Payer: Self-pay | Admitting: Family Medicine

## 2016-02-22 NOTE — Telephone Encounter (Signed)
Refill #20.  Avoid regular use.

## 2016-02-22 NOTE — Telephone Encounter (Signed)
Last Rx given for #30 on 12/1

## 2016-03-25 ENCOUNTER — Other Ambulatory Visit: Payer: Self-pay | Admitting: Family Medicine

## 2016-03-25 NOTE — Telephone Encounter (Signed)
Last refill was 02/22/16. Last office visit 02/08/15.  Okay to fill?

## 2016-03-25 NOTE — Telephone Encounter (Signed)
Needs office follow up

## 2016-03-26 ENCOUNTER — Ambulatory Visit (INDEPENDENT_AMBULATORY_CARE_PROVIDER_SITE_OTHER): Payer: 59 | Admitting: Family Medicine

## 2016-03-26 ENCOUNTER — Encounter: Payer: Self-pay | Admitting: Family Medicine

## 2016-03-26 VITALS — BP 120/80 | HR 74 | Temp 97.9°F | Wt 264.4 lb

## 2016-03-26 DIAGNOSIS — B029 Zoster without complications: Secondary | ICD-10-CM | POA: Diagnosis not present

## 2016-03-26 MED ORDER — VALACYCLOVIR HCL 1 G PO TABS: 1000.0000 mg | ORAL_TABLET | Freq: Three times a day (TID) | ORAL | 0 refills | Status: DC

## 2016-03-26 NOTE — Patient Instructions (Signed)
Shingles Shingles, which is also known as herpes zoster, is an infection that causes a painful skin rash and fluid-filled blisters. Shingles is not related to genital herpes, which is a sexually transmitted infection. Shingles only develops in people who:  Have had chickenpox.  Have received the chickenpox vaccine. (This is rare.)  What are the causes? Shingles is caused by varicella-zoster virus (VZV). This is the same virus that causes chickenpox. After exposure to VZV, the virus stays in the body in an inactive (dormant) state. Shingles develops if the virus reactivates. This can happen many years after the initial exposure to VZV. It is not known what causes this virus to reactivate. What increases the risk? People who have had chickenpox or received the chickenpox vaccine are at risk for shingles. Infection is more common in people who:  Are older than age 50.  Have a weakened defense (immune) system, such as those with HIV, AIDS, or cancer.  Are taking medicines that weaken the immune system, such as transplant medicines.  Are under great stress.  What are the signs or symptoms? Early symptoms of this condition include itching, tingling, and pain in an area on your skin. Pain may be described as burning, stabbing, or throbbing. A few days or weeks after symptoms start, a painful red rash appears, usually on one side of the body in a bandlike or beltlike pattern. The rash eventually turns into fluid-filled blisters that break open, scab over, and dry up in about 2-3 weeks. At any time during the infection, you may also develop:  A fever.  Chills.  A headache.  An upset stomach.  How is this diagnosed? This condition is diagnosed with a skin exam. Sometimes, skin or fluid samples are taken from the blisters before a diagnosis is made. These samples are examined under a microscope or sent to a lab for testing. How is this treated? There is no specific cure for this condition.  Your health care provider will probably prescribe medicines to help you manage pain, recover more quickly, and avoid long-term problems. Medicines may include:  Antiviral drugs.  Anti-inflammatory drugs.  Pain medicines.  If the area involved is on your face, you may be referred to a specialist, such as an eye doctor (ophthalmologist) or an ear, nose, and throat (ENT) doctor to help you avoid eye problems, chronic pain, or disability. Follow these instructions at home: Medicines  Take medicines only as directed by your health care provider.  Apply an anti-itch or numbing cream to the affected area as directed by your health care provider. Blister and Rash Care  Take a cool bath or apply cool compresses to the area of the rash or blisters as directed by your health care provider. This may help with pain and itching.  Keep your rash covered with a loose bandage (dressing). Wear loose-fitting clothing to help ease the pain of material rubbing against the rash.  Keep your rash and blisters clean with mild soap and cool water or as directed by your health care provider.  Check your rash every day for signs of infection. These include redness, swelling, and pain that lasts or increases.  Do not pick your blisters.  Do not scratch your rash. General instructions  Rest as directed by your health care provider.  Keep all follow-up visits as directed by your health care provider. This is important.  Until your blisters scab over, your infection can cause chickenpox in people who have never had it or been vaccinated   against it. To prevent this from happening, avoid contact with other people, especially: ? Babies. ? Pregnant women. ? Children who have eczema. ? Elderly people who have transplants. ? People who have chronic illnesses, such as leukemia or AIDS. Contact a health care provider if:  Your pain is not relieved with prescribed medicines.  Your pain does not get better after  the rash heals.  Your rash looks infected. Signs of infection include redness, swelling, and pain that lasts or increases. Get help right away if:  The rash is on your face or nose.  You have facial pain, pain around your eye area, or loss of feeling on one side of your face.  You have ear pain or you have ringing in your ear.  You have loss of taste.  Your condition gets worse. This information is not intended to replace advice given to you by your health care provider. Make sure you discuss any questions you have with your health care provider. Document Released: 01/06/2005 Document Revised: 09/02/2015 Document Reviewed: 11/17/2013 Elsevier Interactive Patient Education  2017 Elsevier Inc.  

## 2016-03-26 NOTE — Progress Notes (Signed)
Subjective:     Patient ID: [name]., male   DOB: 10-23-1960, 56 y.o.   MRN: [id number]  HPI Patient seen with 5 day history of constant headache. He generally does not suffer headaches. Started temporal area and now spread parietal. He describes as "soreness" and 4 out of 10 intensity. Somewhat of a burning quality. Denies any nausea or vomiting. No head injury. Pain is constant. Sore to touch over scalp. No visual changes.  Nonexertional headache.  Past Medical History:  Diagnosis Date  . Anal fistula   . COPD, mild ([place])   . Seasonal allergies    Past Surgical History:  Procedure Laterality Date  . EVALUATION UNDER ANESTHESIA WITH ANAL FISTULECTOMY N/A 12/09/2012   Procedure: EXAM UNDER ANESTHESIA, FISTULOTOMY;  Surgeon: Leighton Ruff, MD;  Location: [facility];  Service: General;  Laterality: N/A;  . EXAMINATION UNDER ANESTHESIA N/A 08/03/2012   Procedure: placement of seton drain;  Surgeon: Merrie Roof, MD;  Location: [place];  Service: General;  Laterality: N/A;  placement of seton drain  . EXCISION FORGEIN BODY AND GRANULOMA RIGHT INDEX FINGER  07-18-2005  . ORIF LEFT ANKLE FX  1981   HARDWARE REMOVED     reports that he quit smoking about 6 years ago. His smoking use included Cigarettes. He has a 35.00 pack-year smoking history. He has never used smokeless tobacco. He reports that he drinks about 3.6 oz of alcohol per week . He reports that he does not use drugs. family history includes Cancer in his other and sister; Diabetes in his other; Heart disease in his mother; Mental illness in his other. Allergies  Allergen Reactions  . Adhesive [Tape] Other (See Comments)    Skin blister red  . Penicillins Rash     Review of Systems  Constitutional: Negative for chills and fever.  Eyes: Negative for visual disturbance.  Respiratory: Negative for shortness of breath.   Neurological: Positive for headaches. Negative for dizziness, seizures and weakness.   Hematological: Negative for adenopathy. Does not bruise/bleed easily.       Objective:   Physical Exam  Constitutional: He appears well-developed and well-nourished.  HENT:  Right Ear: External ear normal.  Left Ear: External ear normal.  Neck: Neck supple.  Cardiovascular: Normal rate and regular rhythm.   Pulmonary/Chest: Effort normal and breath sounds normal. No respiratory distress. He has no wheezes. He has no rales.  Musculoskeletal: He exhibits no edema.  Lymphadenopathy:    He has no cervical adenopathy.  Skin: Rash noted.  Patient has couple clusters of vesicles up on the forehead near the hairline which are just right of midline       Assessment:     Patient seen with atypical unilateral headache/neuralgia. He has cluster of vesicles which highly suggest shingles    Plan:     -Start Valtrex 1 g 3 times a day for 7 days -At this point pain is minimal and he will try Tylenol or Advil. Follow-up for any worsening pain or new symptoms. -follow up for any peri-ocular spread.  Eulas Post MD [facility] at [facility]

## 2016-03-27 ENCOUNTER — Other Ambulatory Visit: Payer: Self-pay | Admitting: Family Medicine

## 2016-03-28 ENCOUNTER — Telehealth: Payer: Self-pay | Admitting: Family Medicine

## 2016-03-28 ENCOUNTER — Encounter: Payer: Self-pay | Admitting: Family Medicine

## 2016-03-28 ENCOUNTER — Ambulatory Visit (INDEPENDENT_AMBULATORY_CARE_PROVIDER_SITE_OTHER): Payer: 59 | Admitting: Family Medicine

## 2016-03-28 VITALS — BP 110/70 | HR 60 | Temp 98.2°F | Wt 263.2 lb

## 2016-03-28 DIAGNOSIS — B0230 Zoster ocular disease, unspecified: Secondary | ICD-10-CM | POA: Diagnosis not present

## 2016-03-28 MED ORDER — ZOLPIDEM TARTRATE 10 MG PO TABS: 10.0000 mg | ORAL_TABLET | Freq: Every evening | ORAL | 1 refills | Status: DC | PRN

## 2016-03-28 NOTE — Patient Instructions (Signed)
Follow up immediately for any blurred vision Try to avoid rubbing your eye Continue with the Valtrex.

## 2016-03-28 NOTE — Progress Notes (Signed)
Subjective:     Patient ID: Carlos Michaels., male   DOB: [birth year], 56 y.o.   MRN: [id number]  HPI   Patient seen for follow-up regarding shingles. He presented with neuralgic type headache and we noticed small cluster vesicles noted midline of his forehead. He has not some progression of rash toward his right eye. Denies any blurred vision. No eye pain. His pain is no worse and may be slightly better. No fevers or chills. He is taking Valtrex 1 g 3 times a day.  Past Medical History:  Diagnosis Date  . Anal fistula   . COPD, mild ([place])   . Seasonal allergies    Past Surgical History:  Procedure Laterality Date  . EVALUATION UNDER ANESTHESIA WITH ANAL FISTULECTOMY N/A 12/09/2012   Procedure: EXAM UNDER ANESTHESIA, FISTULOTOMY;  Surgeon: Leighton Ruff, MD;  Location: [facility];  Service: General;  Laterality: N/A;  . EXAMINATION UNDER ANESTHESIA N/A 08/03/2012   Procedure: placement of seton drain;  Surgeon: Merrie Roof, MD;  Location: [place];  Service: General;  Laterality: N/A;  placement of seton drain  . EXCISION FORGEIN BODY AND GRANULOMA RIGHT INDEX FINGER  07-18-2005  . ORIF LEFT ANKLE FX  1981   HARDWARE REMOVED     reports that he quit smoking about 6 years ago. His smoking use included Cigarettes. He has a 35.00 pack-year smoking history. He has never used smokeless tobacco. He reports that he drinks about 3.6 oz of alcohol per week . He reports that he does not use drugs. family history includes Cancer in his other and sister; Diabetes in his other; Heart disease in his mother; Mental illness in his other. Allergies  Allergen Reactions  . Adhesive [Tape] Other (See Comments)    Skin blister red  . Penicillins Rash     Review of Systems  Constitutional: Negative for chills and fever.  Eyes: Negative for photophobia, pain, discharge and visual disturbance.  Neurological: Negative for headaches.       Objective:   Physical Exam  Constitutional: He  appears well-developed and well-nourished.  Eyes: Pupils are equal, round, and reactive to light.  Patient has a couple small erythematous vesicles on his right brow region. Conjunctiva appears normal. No eye drainage. Pupils equal round reactive to light. Cornea appears normal. Fundi benign. Fluroscein stain to right eye and no evidence for any corneal defects.  Cardiovascular: Normal rate and regular rhythm.   Pulmonary/Chest: Effort normal and breath sounds normal.  Skin: Rash noted.       Assessment:     Shingles with involvement right scalp and right side of face. Minimal pain.  Fluroscein assessment reveals no corneal involvement.    Plan:     -Continue Valtrex 1 g 3 times a day -Avoid rubbing eye -Follow-up immediately for any blurred vision or eye pain  Carlos Post MD [facility]

## 2016-03-28 NOTE — Progress Notes (Signed)
Pre visit review using our clinic review tool, if applicable. No additional management support is needed unless otherwise documented below in the visit note. 

## 2016-03-28 NOTE — Telephone Encounter (Signed)
Pt seen 3/7 and dx with shingles.  Pt states he was to let dr know if they started to reach his eye. Pt states it is very close to his eye, and the eye is very red.  Not sure if he should return today.

## 2016-03-28 NOTE — Telephone Encounter (Signed)
Spoke with pt and scheduled him to see Dr. Elease Hashimoto this morning. Nothing further needed.

## 2016-04-23 ENCOUNTER — Encounter: Payer: Self-pay | Admitting: Family Medicine

## 2016-04-23 ENCOUNTER — Ambulatory Visit (INDEPENDENT_AMBULATORY_CARE_PROVIDER_SITE_OTHER): Payer: 59 | Admitting: Family Medicine

## 2016-04-23 VITALS — BP 110/80 | HR 75 | Temp 98.6°F | Ht 73.5 in | Wt 253.7 lb

## 2016-04-23 DIAGNOSIS — Z0000 Encounter for general adult medical examination without abnormal findings: Secondary | ICD-10-CM | POA: Diagnosis not present

## 2016-04-23 LAB — CBC WITH DIFFERENTIAL/PLATELET
Basophils Absolute: 0.1 10*3/uL (ref 0.0–0.1)
Basophils Relative: 0.7 % (ref 0.0–3.0)
EOS ABS: 0.2 10*3/uL (ref 0.0–0.7)
Eosinophils Relative: 2.9 % (ref 0.0–5.0)
HCT: 48.7 % (ref 39.0–52.0)
HEMOGLOBIN: 16.5 g/dL (ref 13.0–17.0)
LYMPHS ABS: 1.9 10*3/uL (ref 0.7–4.0)
Lymphocytes Relative: 26.3 % (ref 12.0–46.0)
MCHC: 33.9 g/dL (ref 30.0–36.0)
MCV: 93 fl (ref 78.0–100.0)
MONO ABS: 0.6 10*3/uL (ref 0.1–1.0)
Monocytes Relative: 8.1 % (ref 3.0–12.0)
NEUTROS PCT: 62 % (ref 43.0–77.0)
Neutro Abs: 4.6 10*3/uL (ref 1.4–7.7)
Platelets: 211 10*3/uL (ref 150.0–400.0)
RBC: 5.23 Mil/uL (ref 4.22–5.81)
RDW: 14.7 % (ref 11.5–15.5)
WBC: 7.4 10*3/uL (ref 4.0–10.5)

## 2016-04-23 LAB — PSA: PSA: 0.27 ng/mL (ref 0.10–4.00)

## 2016-04-23 LAB — BASIC METABOLIC PANEL
BUN: 18 mg/dL (ref 6–23)
CHLORIDE: 104 meq/L (ref 96–112)
CO2: 29 meq/L (ref 19–32)
Calcium: 9.5 mg/dL (ref 8.4–10.5)
Creatinine, Ser: 1.06 mg/dL (ref 0.40–1.50)
GFR: 76.76 mL/min (ref >60.00)
GLUCOSE: 115 mg/dL — AB (ref 70–99)
POTASSIUM: 4.8 meq/L (ref 3.5–5.1)
Sodium: 139 mEq/L (ref 135–145)

## 2016-04-23 LAB — LIPID PANEL
CHOL/HDL RATIO: 5
Cholesterol: 213 mg/dL — ABNORMAL HIGH (ref 0–200)
HDL: 47 mg/dL (ref >39.00)
LDL CALC: 150 mg/dL — AB (ref 0–99)
NONHDL: 165.89
Triglycerides: 81 mg/dL (ref 0.0–149.0)
VLDL: 16.2 mg/dL (ref 0.0–40.0)

## 2016-04-23 LAB — HEPATIC FUNCTION PANEL
ALBUMIN: 4.5 g/dL (ref 3.5–5.2)
ALK PHOS: 56 U/L (ref 39–117)
ALT: 26 U/L (ref 0–53)
AST: 25 U/L (ref 0–37)
BILIRUBIN TOTAL: 0.7 mg/dL (ref 0.2–1.2)
Bilirubin, Direct: 0.1 mg/dL (ref 0.0–0.3)
Total Protein: 7.4 g/dL (ref 6.0–8.3)

## 2016-04-23 LAB — TSH: TSH: 1.52 u[IU]/mL (ref 0.35–4.50)

## 2016-04-23 NOTE — Patient Instructions (Signed)
Remember to get repeat colonoscopy later this year (after June)

## 2016-04-23 NOTE — Progress Notes (Signed)
Subjective:     Patient ID: [name]., male   DOB: November 09, 1960, 56 y.o.   MRN: [id number]  HPI Patient seen for physical exam. He quit smoking about 4 years ago. Currently takes no regular medications. He is ? due for repeat colonoscopy this June. History of 10 mm flat non-adenomatous polyp in the cecum and his chart recommends 3 year follow-up but question whether this be 7 years. He's had previous perianal abscess but no recent recurrence.  Has gained some weight during the past year and recently started dietary program try to lose some weight and is trying to be more active. No recent chest pains or other complaints. Denies any specific risk factors for HIV or hepatitis C and he declines screening of both today  Past Medical History:  Diagnosis Date  . Anal fistula   . COPD, mild ([place])   . Seasonal allergies    Past Surgical History:  Procedure Laterality Date  . EVALUATION UNDER ANESTHESIA WITH ANAL FISTULECTOMY N/A 12/09/2012   Procedure: EXAM UNDER ANESTHESIA, FISTULOTOMY;  Surgeon: Leighton Ruff, MD;  Location: [facility];  Service: General;  Laterality: N/A;  . EXAMINATION UNDER ANESTHESIA N/A 08/03/2012   Procedure: placement of seton drain;  Surgeon: Merrie Roof, MD;  Location: [place];  Service: General;  Laterality: N/A;  placement of seton drain  . EXCISION FORGEIN BODY AND GRANULOMA RIGHT INDEX FINGER  07-18-2005  . ORIF LEFT ANKLE FX  1981   HARDWARE REMOVED     reports that he quit smoking about 6 years ago. His smoking use included Cigarettes. He has a 35.00 pack-year smoking history. He has never used smokeless tobacco. He reports that he drinks about 3.6 oz of alcohol per week . He reports that he does not use drugs. family history includes Cancer in his other and sister; Diabetes in his other; Heart disease in his mother; Mental illness in his other. Allergies  Allergen Reactions  . Adhesive [Tape] Other (See Comments)    Skin blister red  .  Penicillins Rash     Review of Systems  Constitutional: Negative for activity change, appetite change, fatigue and fever.  HENT: Negative for congestion, ear pain and trouble swallowing.   Eyes: Negative for pain and visual disturbance.  Respiratory: Negative for cough, shortness of breath and wheezing.   Cardiovascular: Negative for chest pain and palpitations.  Gastrointestinal: Negative for abdominal distention, abdominal pain, blood in stool, constipation, diarrhea, nausea, rectal pain and vomiting.  Genitourinary: Negative for dysuria, hematuria and testicular pain.  Musculoskeletal: Negative for arthralgias and joint swelling.  Skin: Negative for rash.  Neurological: Negative for dizziness, syncope and headaches.  Hematological: Negative for adenopathy.  Psychiatric/Behavioral: Negative for confusion and dysphoric mood.       Objective:   Physical Exam  Constitutional: He is oriented to person, place, and time. He appears well-developed and well-nourished. No distress.  HENT:  Head: Normocephalic and atraumatic.  Right Ear: External ear normal.  Left Ear: External ear normal.  Mouth/Throat: Oropharynx is clear and moist.  Eyes: Conjunctivae and EOM are normal. Pupils are equal, round, and reactive to light.  Neck: Normal range of motion. Neck supple. No thyromegaly present.  Cardiovascular: Normal rate, regular rhythm and normal heart sounds.   No murmur heard. Pulmonary/Chest: No respiratory distress. He has no wheezes. He has no rales.  Abdominal: Soft. Bowel sounds are normal. He exhibits no distension. There is no tenderness. There is no rebound and no  guarding.  Soft umbilical hernia which is nontender  Musculoskeletal: He exhibits no edema.  Lymphadenopathy:    He has no cervical adenopathy.  Neurological: He is alert and oriented to person, place, and time. He displays normal reflexes. No cranial nerve deficit.  Skin: No rash noted.  Psychiatric: He has a normal  mood and affect.       Assessment:     Physical exam. Patient had some significant weight gain during the past year.  History of non-adenomatous colon polyps and history of prediabetes    Plan:     -Fasting labs obtained -The natural history of prostate cancer and ongoing controversy regarding screening and potential treatment outcomes of prostate cancer has been discussed with the patient. The meaning of a false positive PSA and a false negative PSA has been discussed. He indicates understanding of the limitations of this screening test and wishes to proceed with screening PSA testing. -We strongly advise weight loss and discussed strategies -He declines hepatitis C antibody and HIV antibody testing  Eulas Post MD [facility] at [facility]

## 2016-04-23 NOTE — Progress Notes (Signed)
Pre visit review using our clinic review tool, if applicable. No additional management support is needed unless otherwise documented below in the visit note. 

## 2016-05-06 ENCOUNTER — Encounter: Payer: Self-pay | Admitting: Gastroenterology

## 2016-07-10 ENCOUNTER — Encounter: Payer: Self-pay | Admitting: Family Medicine

## 2016-07-10 ENCOUNTER — Ambulatory Visit (INDEPENDENT_AMBULATORY_CARE_PROVIDER_SITE_OTHER): Payer: 59 | Admitting: Family Medicine

## 2016-07-10 VITALS — BP 123/77 | HR 64 | Temp 98.2°F | Ht 73.5 in | Wt 238.0 lb

## 2016-07-10 DIAGNOSIS — J018 Other acute sinusitis: Secondary | ICD-10-CM | POA: Diagnosis not present

## 2016-07-10 MED ORDER — METHYLPREDNISOLONE ACETATE 40 MG/ML IJ SUSP
40.0000 mg | Freq: Once | INTRAMUSCULAR | Status: AC
  Administered 2016-07-10: 40 mg via INTRAMUSCULAR

## 2016-07-10 MED ORDER — METHYLPREDNISOLONE ACETATE 80 MG/ML IJ SUSP
80.0000 mg | Freq: Once | INTRAMUSCULAR | Status: AC
  Administered 2016-07-10: 80 mg via INTRAMUSCULAR

## 2016-07-10 MED ORDER — AZITHROMYCIN 250 MG PO TABS: ORAL_TABLET | ORAL | 0 refills | Status: DC

## 2016-07-10 NOTE — Addendum Note (Signed)
Addended by: Sandria Bales B on: 07/10/2016 11:08 AM   Modules accepted: Orders

## 2016-07-10 NOTE — Progress Notes (Signed)
   Subjective:    Patient ID: [name]., male    DOB: 1960/10/03, 56 y.o.   MRN: [id number]  HPI Here for 2 weeks of sinus pressure, pressure in both ears, PND, and coughing up yellow sputum. No fever. Using Mucinex D.    Review of Systems  Constitutional: Negative.   HENT: Positive for congestion, postnasal drip and sinus pressure. Negative for ear pain, sinus pain and sore throat.   Eyes: Negative.   Respiratory: Positive for cough.        Objective:   Physical Exam  Constitutional: He appears well-developed and well-nourished.  HENT:  Right Ear: External ear normal.  Nose: Nose normal.  Mouth/Throat: Oropharynx is clear and moist.  Eyes: Conjunctivae are normal.  Neck: Neck supple. No thyromegaly present.  Pulmonary/Chest: Effort normal and breath sounds normal. No respiratory distress. He has no wheezes. He has no rales.  Lymphadenopathy:    He has no cervical adenopathy.          Assessment & Plan:  Sinusitis, treat with a Zpack and a steroid shot.  Alysia Penna, MD

## 2016-07-10 NOTE — Patient Instructions (Signed)
WE NOW OFFER    [facility] FAST TRACK!!!  SAME DAY Appointments for ACUTE CARE  Such as: Sprains, Injuries, cuts, abrasions, rashes, muscle pain, joint pain, back pain Colds, flu, sore throats, headache, allergies, cough, fever  Ear pain, sinus and eye infections Abdominal pain, nausea, vomiting, diarrhea, upset stomach Animal/insect bites  3 Easy Ways to Schedule: Walk-In Scheduling Call in scheduling Mychart Sign-up: [email or web address]         

## 2016-07-14 ENCOUNTER — Ambulatory Visit (INDEPENDENT_AMBULATORY_CARE_PROVIDER_SITE_OTHER): Payer: 59 | Admitting: Family Medicine

## 2016-07-14 ENCOUNTER — Encounter: Payer: Self-pay | Admitting: Family Medicine

## 2016-07-14 ENCOUNTER — Encounter: Payer: Self-pay | Admitting: Gastroenterology

## 2016-07-14 VITALS — BP 108/70 | HR 63 | Temp 98.5°F | Wt 237.1 lb

## 2016-07-14 DIAGNOSIS — H6503 Acute serous otitis media, bilateral: Secondary | ICD-10-CM | POA: Diagnosis not present

## 2016-07-14 NOTE — Progress Notes (Signed)
Subjective:     Patient ID: [name]., male   DOB: [birth year], 56 y.o.   MRN: [id number]  HPI Patient seen with approximately 3 week history of bilateral ear "fullness ". He returned from trip to [place] and about 2 days after return noticed some issues as above. He was seen here last week and prescribed Zithromax and given Depo-Medrol. He states his symptoms are slightly improved. Denies any nasal congestion. No ear pain. Also tried Mucinex DM without much relief. He had similar problem in the past. No vertigo.  Past Medical History:  Diagnosis Date  . Anal fistula   . COPD, mild ([place])   . Seasonal allergies    Past Surgical History:  Procedure Laterality Date  . EVALUATION UNDER ANESTHESIA WITH ANAL FISTULECTOMY N/A 12/09/2012   Procedure: EXAM UNDER ANESTHESIA, FISTULOTOMY;  Surgeon: Leighton Ruff, MD;  Location: [facility];  Service: General;  Laterality: N/A;  . EXAMINATION UNDER ANESTHESIA N/A 08/03/2012   Procedure: placement of seton drain;  Surgeon: Merrie Roof, MD;  Location: [place];  Service: General;  Laterality: N/A;  placement of seton drain  . EXCISION FORGEIN BODY AND GRANULOMA RIGHT INDEX FINGER  07-18-2005  . ORIF LEFT ANKLE FX  1981   HARDWARE REMOVED     reports that he quit smoking about 6 years ago. His smoking use included Cigarettes. He has a 35.00 pack-year smoking history. He has never used smokeless tobacco. He reports that he drinks about 3.6 oz of alcohol per week . He reports that he does not use drugs. family history includes Cancer in his other and sister; Diabetes in his other; Heart disease in his mother; Mental illness in his other. Allergies  Allergen Reactions  . Adhesive [Tape] Other (See Comments)    Skin blister red  . Penicillins Rash     Review of Systems  Constitutional: Negative for chills and fever.  HENT: Negative for ear discharge, ear pain, sinus pain and sinus pressure.        Objective:   Physical Exam   Constitutional: He appears well-developed and well-nourished.  HENT:  Mouth/Throat: Oropharynx is clear and moist.  No cerumen in ear canal. He has evidence for serous otitis right greater than left. He has some serous colored fluid but no evidence for any purulent secretions. No erythema.  Cardiovascular: Normal rate and regular rhythm.   Pulmonary/Chest: Effort normal and breath sounds normal. No respiratory distress. He has no wheezes. He has no rales.       Assessment:     Bilateral serous otitis media    Plan:     -We explained these can take sometimes several months to fully resolved. -Has already tried decongestants along with Mucinex and steroids as above -We recommend giving this another couple weeks. If not further improved at that point consider ENT referral  Eulas Post MD [facility] at [facility]

## 2016-07-14 NOTE — Patient Instructions (Signed)

## 2016-09-03 ENCOUNTER — Ambulatory Visit (AMBULATORY_SURGERY_CENTER): Payer: Self-pay

## 2016-09-03 VITALS — Ht 73.5 in | Wt 226.8 lb

## 2016-09-03 DIAGNOSIS — Z860100 Personal history of colon polyps, unspecified: Secondary | ICD-10-CM

## 2016-09-03 MED ORDER — SUPREP BOWEL PREP KIT 17.5-3.13-1.6 GM/177ML PO SOLN: 1.0000 | Freq: Once | ORAL | 0 refills | Status: AC

## 2016-09-03 NOTE — Progress Notes (Signed)
No allergies to eggs or soy No home oxygen No diet meds No past problems with anesthesia  Registered emmi 

## 2016-09-08 ENCOUNTER — Encounter: Payer: Self-pay | Admitting: Gastroenterology

## 2016-09-17 ENCOUNTER — Encounter: Payer: Self-pay | Admitting: Gastroenterology

## 2016-09-17 ENCOUNTER — Ambulatory Visit (AMBULATORY_SURGERY_CENTER): Payer: 59 | Admitting: Gastroenterology

## 2016-09-17 VITALS — BP 110/72 | HR 54 | Temp 96.2°F | Resp 16 | Ht 73.5 in | Wt 226.0 lb

## 2016-09-17 DIAGNOSIS — Z86010 Personal history of colonic polyps: Secondary | ICD-10-CM

## 2016-09-17 DIAGNOSIS — K635 Polyp of colon: Secondary | ICD-10-CM | POA: Diagnosis not present

## 2016-09-17 DIAGNOSIS — D123 Benign neoplasm of transverse colon: Secondary | ICD-10-CM

## 2016-09-17 MED ORDER — SODIUM CHLORIDE 0.9 % IV SOLN: 500.0000 mL | INTRAVENOUS | Status: DC

## 2016-09-17 NOTE — Progress Notes (Signed)
A/ox3 pleased with MAC, report to Sarah RN 

## 2016-09-17 NOTE — Op Note (Signed)
[name]: [name] Procedure Date: 09/17/2016 7:55 AM MRN: [id number] Endoscopist: Stanley. [name] , MD Age: 56 Referring MD:  Date of Birth: 1960-05-11 Gender: Male Account #: [id number] Procedure:                Colonoscopy Indications:              Personal history of sessile serrated colon polyp                            (10 mm or greater in size) Medicines:                Monitored Anesthesia Care Procedure:                Pre-Anesthesia Assessment:                           - Prior to the procedure, a History and Physical                            was performed, and patient medications and                            allergies were reviewed. The patient's tolerance of                            previous anesthesia was also reviewed. The risks                            and benefits of the procedure and the sedation                            options and risks were discussed with the patient.                            All questions were answered, and informed consent                            was obtained. Prior Anticoagulants: The patient has                            taken no previous anticoagulant or antiplatelet                            agents. ASA Grade Assessment: II - A patient with                            mild systemic disease. After reviewing the risks                            and benefits, the patient was deemed in                            satisfactory condition to undergo the procedure.  After obtaining informed consent, the colonoscope                            was passed under direct vision. Throughout the                            procedure, the patient's blood pressure, pulse, and                            oxygen saturations were monitored continuously. The                            Model CF-HQ190L [id number]) scope was introduced                            through the anus and advanced to the the  cecum,                            identified by appendiceal orifice and ileocecal                            valve. The colonoscopy was performed without                            difficulty. The patient tolerated the procedure                            well. The quality of the bowel preparation was                            excellent. The ileocecal valve, appendiceal                            orifice, and rectum were photographed. The quality                            of the bowel preparation was evaluated using the                            BBPS [facility] Bowel Preparation Scale) with scores                            of: Right Colon = 3, Transverse Colon = 3 and Left                            Colon = 3 (entire mucosa seen well with no residual                            staining, small fragments of stool or opaque                            liquid). The total BBPS score equals 9. The bowel  preparation used was SUPREP. Scope In: 8:02:39 AM Scope Out: 8:20:35 AM Scope Withdrawal Time: 0 hours 16 minutes 29 seconds  Total Procedure Duration: 0 hours 17 minutes 56 seconds  Findings:                 The perianal and digital rectal examinations were                            normal.                           Multiple diverticula were found in the left colon.                           Two sessile polyps were found in the mid transverse                            colon. The polyps were 2 to 4 mm in size. These                            polyps were removed with a cold snare. Resection                            and retrieval were complete.                           The exam was otherwise without abnormality on                            direct and retroflexion views. Complications:            No immediate complications. Estimated Blood Loss:     Estimated blood loss: none. Impression:               - Diverticulosis in the left colon.                            - Two 2 to 4 mm polyps in the mid transverse colon,                            removed with a cold snare. Resected and retrieved.                           - The examination was otherwise normal on direct                            and retroflexion views. Recommendation:           - Patient has a contact number available for                            emergencies. The signs and symptoms of potential                            delayed complications were discussed with the  patient. Return to normal activities tomorrow.                            Written discharge instructions were provided to the                            patient.                           - Resume previous diet.                           - Continue present medications.                           - Await pathology results.                           - Repeat colonoscopy is recommended for                            surveillance. The colonoscopy date will be                            determined after pathology results from today's                            exam become available for review. Carlos L. Carlos Carrow, MD 09/17/2016 8:25:46 AM This report has been signed electronically.

## 2016-09-17 NOTE — Progress Notes (Signed)
Pt's states no medical or surgical changes since previsit or office visit. 

## 2016-09-17 NOTE — Patient Instructions (Signed)
YOU HAD AN ENDOSCOPIC PROCEDURE TODAY AT THE [facility]:   Refer to the procedure report that was given to you for any specific questions about what was found during the examination.  If the procedure report does not answer your questions, please call your gastroenterologist to clarify.  If you requested that your care partner not be given the details of your procedure findings, then the procedure report has been included in a sealed envelope for you to review at your convenience later.  YOU SHOULD EXPECT: Some feelings of bloating in the abdomen. Passage of more gas than usual.  Walking can help get rid of the air that was put into your GI tract during the procedure and reduce the bloating. If you had a lower endoscopy (such as a colonoscopy or flexible sigmoidoscopy) you may notice spotting of blood in your stool or on the toilet paper. If you underwent a bowel prep for your procedure, you may not have a normal bowel movement for a few days.  Please Note:  You might notice some irritation and congestion in your nose or some drainage.  This is from the oxygen used during your procedure.  There is no need for concern and it should clear up in a day or so.  SYMPTOMS TO REPORT IMMEDIATELY:   Following lower endoscopy (colonoscopy or flexible sigmoidoscopy):  Excessive amounts of blood in the stool  Significant tenderness or worsening of abdominal pains  Swelling of the abdomen that is new, acute  Fever of 100F or higher  For urgent or emergent issues, a gastroenterologist can be reached at any hour by calling [phone].   DIET:  We do recommend a small meal at first, but then you may proceed to your regular diet.  Drink plenty of fluids but you should avoid alcoholic beverages for 24 hours.  MEDICATIONS: Continue present medications.  Please see handouts given to you by your recovery nurse.  ACTIVITY:  You should plan to take it easy for the rest of today and you should NOT  DRIVE or use heavy machinery until tomorrow (because of the sedation medicines used during the test).    FOLLOW UP: Our staff will call the number listed on your records the next business day following your procedure to check on you and address any questions or concerns that you may have regarding the information given to you following your procedure. If we do not reach you, we will leave a message.  However, if you are feeling well and you are not experiencing any problems, there is no need to return our call.  We will assume that you have returned to your regular daily activities without incident.  If any biopsies were taken you will be contacted by phone or by letter within the next 1-3 weeks.  Please call us at [phone] if you have not heard about the biopsies in 3 weeks.   Thank you for allowing us to provide for your healthcare needs today.   SIGNATURES/CONFIDENTIALITY: You and/or your care partner have signed paperwork which will be entered into your electronic medical record.  These signatures attest to the fact that that the information above on your After Visit Summary has been reviewed and is understood.  Full responsibility of the confidentiality of this discharge information lies with you and/or your care-partner. 

## 2016-09-17 NOTE — Progress Notes (Signed)
Called to room to assist during endoscopic procedure.  Patient ID and intended procedure confirmed with present staff. Received instructions for my participation in the procedure from the performing physician.  

## 2016-09-18 ENCOUNTER — Telehealth: Payer: Self-pay | Admitting: *Deleted

## 2016-09-18 NOTE — Telephone Encounter (Signed)
  Follow up Call-  Call back number 09/17/2016  Post procedure Call Back phone  # [phone]  Permission to leave phone message Yes  Some recent data might be hidden     Patient questions:  Do you have a fever, pain , or abdominal swelling? No. Pain Score  0 *  Have you tolerated food without any problems? Yes.    Have you been able to return to your normal activities? Yes.    Do you have any questions about your discharge instructions: Diet   No. Medications  No. Follow up visit  No.  Do you have questions or concerns about your Care? No.  Actions: * If pain score is 4 or above: No action needed, pain <4.

## 2016-09-24 ENCOUNTER — Encounter: Payer: Self-pay | Admitting: Gastroenterology

## 2016-09-30 ENCOUNTER — Other Ambulatory Visit: Payer: Self-pay | Admitting: Family Medicine

## 2016-10-01 NOTE — Telephone Encounter (Signed)
Last refill 03/28/16 and last office visit 07/14/16.  Okay to fill?

## 2016-10-01 NOTE — Telephone Encounter (Signed)
Refill with 1 additional refill. Try to avoid regular use.

## 2016-10-09 ENCOUNTER — Encounter: Payer: Self-pay | Admitting: Family Medicine

## 2016-10-27 ENCOUNTER — Ambulatory Visit (INDEPENDENT_AMBULATORY_CARE_PROVIDER_SITE_OTHER): Payer: 59 | Admitting: Family Medicine

## 2016-10-27 ENCOUNTER — Encounter: Payer: Self-pay | Admitting: Family Medicine

## 2016-10-27 VITALS — BP 124/72 | HR 80 | Temp 98.5°F | Resp 16 | Wt 218.0 lb

## 2016-10-27 DIAGNOSIS — N5089 Other specified disorders of the male genital organs: Secondary | ICD-10-CM

## 2016-10-27 DIAGNOSIS — N509 Disorder of male genital organs, unspecified: Secondary | ICD-10-CM

## 2016-10-27 DIAGNOSIS — Z23 Encounter for immunization: Secondary | ICD-10-CM

## 2016-10-27 NOTE — Progress Notes (Signed)
Subjective:     Patient ID: [name]., male   DOB: 12-26-1960, 56 y.o.   MRN: [id number]  HPI Patient seen for evaluation of scrotal masses. He was seen back in October 2014 had ultrasound which showed left-sided hydrocele and varicocele along with increased epididymis enlargement on the right with probably multiple cysts. He has recently noted some "sagging "of the scrotum when he sits on the toilet. He has not had any pain. No dysuria. No adenopathy. No history of hernia.  Past Medical History:  Diagnosis Date  . Allergy   . Anal fistula   . COPD, mild ([place])   . Seasonal allergies    Past Surgical History:  Procedure Laterality Date  . EVALUATION UNDER ANESTHESIA WITH ANAL FISTULECTOMY N/A 12/09/2012   Procedure: EXAM UNDER ANESTHESIA, FISTULOTOMY;  Surgeon: Leighton Ruff, MD;  Location: [facility];  Service: General;  Laterality: N/A;  . EXAMINATION UNDER ANESTHESIA N/A 08/03/2012   (anal fistula) placement of seton drain; Dr Autumn Messing   . EXCISION FORGEIN BODY AND GRANULOMA RIGHT INDEX FINGER  07-18-2005  . ORIF LEFT ANKLE FX  1981   HARDWARE REMOVED     reports that he quit smoking about 6 years ago. His smoking use included Cigarettes. He has a 35.00 pack-year smoking history. He has never used smokeless tobacco. He reports that he drinks about 3.6 oz of alcohol per week . He reports that he does not use drugs. family history includes Cancer in his other and sister; Diabetes in his other; Heart disease in his mother; Mental illness in his other. Allergies  Allergen Reactions  . Adhesive [Tape] Other (See Comments)    Skin blister red  . Penicillins Rash     Review of Systems  Constitutional: Negative for appetite change and unexpected weight change.  Gastrointestinal: Negative for abdominal pain.  Genitourinary: Positive for scrotal swelling. Negative for dysuria, hematuria, penile pain and testicular pain.       Objective:   Physical Exam   Constitutional: He appears well-developed and well-nourished.  Cardiovascular: Normal rate and regular rhythm.   Pulmonary/Chest: Effort normal and breath sounds normal. No respiratory distress. He has no wheezes. He has no rales.  Genitourinary:  Genitourinary Comments: Patient has multiple scrotal masses on the right and left side. These are nontender. No definite hernia. Masses vary in size and are very smooth and rounded       Assessment:     Multiple scrotal masses. Suspect based on previous x-rays combination of hydrocele, varicocele, and epididymal cysts    Plan:     -Flu vaccine given -Recommend urology referral for further evaluation and patient agrees -Continue good support underwear  Eulas Post MD [place] [facility] at [facility]

## 2016-10-27 NOTE — Patient Instructions (Signed)
Hydrocele, Adult A hydrocele is a collection of fluid in the loose pouch of skin that holds the testicles (scrotum). Usually, it affects only one testicle. What are the causes? This condition may be caused by:  An injury to the scrotum.  An infection.  A tumor or cancer of the testicle.  Twisting of a testicle.  Decreased blood flow to the scrotum.  What are the signs or symptoms? A hydrocele feels like a water-filled balloon. It may also feel heavy. A hydrocele can cause:  Swelling of the scrotum. The swelling may decrease when you lie down.  Swelling of the groin.  Mild discomfort in the scrotum.  Pain. This can develop if the hydrocele was caused by infection or twisting.  How is this diagnosed? This condition may be diagnosed with a medical history, physical exam, and imaging tests. You may also have blood and urine tests to check for infection. How is this treated? Treatment may include:  Watching and waiting, particularly if the hydrocele causes no symptoms.  Treatment of the underlying condition. This may include using antibiotic medicine.  Surgery to drain the fluid. Some surgical options include: ? Needle aspiration. For this procedure, a needle is used to drain fluid. ? Hydrocelectomy. For this procedure, an incision is made in the scrotum to remove the fluid sac.  Follow these instructions at home:  Keep all follow-up visits as told by your health care provider. This is important.  Watch the hydrocele for any changes.  Take over-the-counter and prescription medicines only as told by your health care provider.  If you were prescribed an antibiotic medicine, use it as told by your health care provider. Do not stop using the antibiotic even if your condition improves. Contact a health care provider if:  The swelling in your scrotum or groin gets worse.  The hydrocele becomes red, firm, tender to the touch, or painful.  You notice any changes in the  hydrocele.  You have a fever. This information is not intended to replace advice given to you by your health care provider. Make sure you discuss any questions you have with your health care provider. Document Released: 06/26/2009 Document Revised: 06/14/2015 Document Reviewed: 01/02/2014 Elsevier Interactive Patient Education  2018 [employer]  

## 2016-10-27 NOTE — Addendum Note (Signed)
Addended by: Gordy Councilman on: 10/27/2016 03:20 PM   Modules accepted: Orders

## 2016-12-17 ENCOUNTER — Other Ambulatory Visit: Payer: Self-pay | Admitting: Urology

## 2017-01-19 ENCOUNTER — Other Ambulatory Visit: Payer: Self-pay

## 2017-01-19 ENCOUNTER — Encounter (HOSPITAL_BASED_OUTPATIENT_CLINIC_OR_DEPARTMENT_OTHER): Payer: Self-pay | Admitting: *Deleted

## 2017-01-19 NOTE — Progress Notes (Signed)
SPOKE W/ PT VIA PHONE FOR PRE-OP INTERVIEW.  NPO AFTER MN.  ARRIVE AT 0930.  NEEDS HG.

## 2017-01-30 ENCOUNTER — Encounter (HOSPITAL_BASED_OUTPATIENT_CLINIC_OR_DEPARTMENT_OTHER)
Admission: RE | Disposition: A | Discharge status: Home / Self Care | Payer: Self-pay | Source: Ambulatory Visit | Attending: Urology

## 2017-01-30 ENCOUNTER — Ambulatory Visit (HOSPITAL_BASED_OUTPATIENT_CLINIC_OR_DEPARTMENT_OTHER)
Admission: RE | Admit: 2017-01-30 | Discharge: 2017-01-30 | Disposition: A | Discharge status: Home / Self Care | Payer: 59 | Source: Ambulatory Visit | Attending: Urology | Admitting: Urology

## 2017-01-30 ENCOUNTER — Ambulatory Visit (HOSPITAL_BASED_OUTPATIENT_CLINIC_OR_DEPARTMENT_OTHER): Payer: 59 | Admitting: Anesthesiology

## 2017-01-30 ENCOUNTER — Encounter (HOSPITAL_BASED_OUTPATIENT_CLINIC_OR_DEPARTMENT_OTHER): Payer: Self-pay | Admitting: *Deleted

## 2017-01-30 ENCOUNTER — Other Ambulatory Visit: Payer: Self-pay

## 2017-01-30 DIAGNOSIS — N433 Hydrocele, unspecified: Secondary | ICD-10-CM | POA: Diagnosis not present

## 2017-01-30 DIAGNOSIS — Z91048 Other nonmedicinal substance allergy status: Secondary | ICD-10-CM | POA: Insufficient documentation

## 2017-01-30 DIAGNOSIS — Z91040 Latex allergy status: Secondary | ICD-10-CM | POA: Diagnosis not present

## 2017-01-30 DIAGNOSIS — Z880 Allergy status to penicillin: Secondary | ICD-10-CM | POA: Diagnosis not present

## 2017-01-30 DIAGNOSIS — Z86010 Personal history of colonic polyps: Secondary | ICD-10-CM | POA: Insufficient documentation

## 2017-01-30 DIAGNOSIS — Z87891 Personal history of nicotine dependence: Secondary | ICD-10-CM | POA: Diagnosis not present

## 2017-01-30 DIAGNOSIS — J449 Chronic obstructive pulmonary disease, unspecified: Secondary | ICD-10-CM | POA: Diagnosis not present

## 2017-01-30 HISTORY — DX: Personal history of other diseases of the digestive system: Z87.19

## 2017-01-30 HISTORY — PX: HYDROCELE EXCISION: SHX482

## 2017-01-30 HISTORY — DX: Hydrocele, unspecified: N43.3

## 2017-01-30 HISTORY — DX: Personal history of colon polyps, unspecified: Z86.0100

## 2017-01-30 HISTORY — DX: Personal history of colonic polyps: Z86.010

## 2017-01-30 SURGERY — HYDROCELECTOMY
Anesthesia: General | Laterality: Right

## 2017-01-30 MED ORDER — ACETAMINOPHEN 500 MG PO TABS
1000.0000 mg | ORAL_TABLET | Freq: Four times a day (QID) | ORAL | Status: DC | PRN
  Administered 2017-01-30: 1000 mg via ORAL
  Filled 2017-01-30: qty 2

## 2017-01-30 MED ORDER — ONDANSETRON HCL 4 MG/2ML IJ SOLN
INTRAMUSCULAR | Status: AC
  Filled 2017-01-30: qty 2

## 2017-01-30 MED ORDER — MIDAZOLAM HCL 5 MG/5ML IJ SOLN
INTRAMUSCULAR | Status: DC | PRN
  Administered 2017-01-30: 2 mg via INTRAVENOUS

## 2017-01-30 MED ORDER — HYDROMORPHONE HCL 1 MG/ML IJ SOLN
0.2500 mg | INTRAMUSCULAR | Status: DC | PRN
  Filled 2017-01-30: qty 0.5

## 2017-01-30 MED ORDER — HYDROCODONE-ACETAMINOPHEN 7.5-325 MG PO TABS
1.0000 | ORAL_TABLET | Freq: Once | ORAL | Status: DC | PRN
  Filled 2017-01-30: qty 1

## 2017-01-30 MED ORDER — PROMETHAZINE HCL 25 MG/ML IJ SOLN
6.2500 mg | INTRAMUSCULAR | Status: DC | PRN
  Filled 2017-01-30: qty 1

## 2017-01-30 MED ORDER — BUPIVACAINE HCL (PF) 0.25 % IJ SOLN
INTRAMUSCULAR | Status: DC | PRN
  Administered 2017-01-30: 17 mL

## 2017-01-30 MED ORDER — PROPOFOL 10 MG/ML IV BOLUS
INTRAVENOUS | Status: DC | PRN
  Administered 2017-01-30: 200 mg via INTRAVENOUS

## 2017-01-30 MED ORDER — CIPROFLOXACIN IN D5W 400 MG/200ML IV SOLN
400.0000 mg | INTRAVENOUS | Status: AC
  Administered 2017-01-30: 400 mg via INTRAVENOUS
  Filled 2017-01-30: qty 200

## 2017-01-30 MED ORDER — ONDANSETRON HCL 4 MG/2ML IJ SOLN
INTRAMUSCULAR | Status: DC | PRN
  Administered 2017-01-30: 4 mg via INTRAVENOUS

## 2017-01-30 MED ORDER — PROPOFOL 10 MG/ML IV BOLUS
INTRAVENOUS | Status: AC
  Filled 2017-01-30: qty 20

## 2017-01-30 MED ORDER — DEXAMETHASONE SODIUM PHOSPHATE 4 MG/ML IJ SOLN
INTRAMUSCULAR | Status: DC | PRN
  Administered 2017-01-30: 10 mg via INTRAVENOUS

## 2017-01-30 MED ORDER — ACETAMINOPHEN 500 MG PO TABS
ORAL_TABLET | ORAL | Status: AC
  Filled 2017-01-30: qty 2

## 2017-01-30 MED ORDER — GABAPENTIN 300 MG PO CAPS
300.0000 mg | ORAL_CAPSULE | Freq: Once | ORAL | Status: AC
Start: 2017-01-30 — End: 2017-01-30
  Administered 2017-01-30: 300 mg via ORAL
  Filled 2017-01-30: qty 1

## 2017-01-30 MED ORDER — MEPERIDINE HCL 25 MG/ML IJ SOLN
6.2500 mg | INTRAMUSCULAR | Status: DC | PRN
  Filled 2017-01-30: qty 1

## 2017-01-30 MED ORDER — HYDROCODONE-ACETAMINOPHEN 5-325 MG PO TABS: 1.0000 | ORAL_TABLET | ORAL | 0 refills | Status: DC | PRN

## 2017-01-30 MED ORDER — LIDOCAINE 2% (20 MG/ML) 5 ML SYRINGE
INTRAMUSCULAR | Status: DC | PRN
  Administered 2017-01-30: 100 mg via INTRAVENOUS

## 2017-01-30 MED ORDER — FENTANYL CITRATE (PF) 100 MCG/2ML IJ SOLN
INTRAMUSCULAR | Status: DC | PRN
  Administered 2017-01-30: 50 ug via INTRAVENOUS

## 2017-01-30 MED ORDER — EPHEDRINE 5 MG/ML INJ
INTRAVENOUS | Status: AC
  Filled 2017-01-30: qty 10

## 2017-01-30 MED ORDER — LACTATED RINGERS IV SOLN
INTRAVENOUS | Status: DC
  Administered 2017-01-30: 10:00:00 via INTRAVENOUS
  Filled 2017-01-30: qty 1000

## 2017-01-30 MED ORDER — LIDOCAINE 2% (20 MG/ML) 5 ML SYRINGE
INTRAMUSCULAR | Status: AC
  Filled 2017-01-30: qty 5

## 2017-01-30 MED ORDER — GABAPENTIN 300 MG PO CAPS
ORAL_CAPSULE | ORAL | Status: AC
  Filled 2017-01-30: qty 1

## 2017-01-30 MED ORDER — EPHEDRINE SULFATE-NACL 50-0.9 MG/10ML-% IV SOSY
PREFILLED_SYRINGE | INTRAVENOUS | Status: DC | PRN
  Administered 2017-01-30: 15 mg via INTRAVENOUS

## 2017-01-30 MED ORDER — ACETAMINOPHEN 10 MG/ML IV SOLN
1000.0000 mg | Freq: Once | INTRAVENOUS | Status: DC | PRN
  Filled 2017-01-30: qty 100

## 2017-01-30 MED ORDER — MIDAZOLAM HCL 2 MG/2ML IJ SOLN
INTRAMUSCULAR | Status: AC
  Filled 2017-01-30: qty 2

## 2017-01-30 MED ORDER — DEXAMETHASONE SODIUM PHOSPHATE 10 MG/ML IJ SOLN
INTRAMUSCULAR | Status: AC
  Filled 2017-01-30: qty 1

## 2017-01-30 MED ORDER — FENTANYL CITRATE (PF) 100 MCG/2ML IJ SOLN
INTRAMUSCULAR | Status: AC
Start: 2017-01-30 — End: ?
  Filled 2017-01-30: qty 2

## 2017-01-30 MED ORDER — CIPROFLOXACIN IN D5W 400 MG/200ML IV SOLN
INTRAVENOUS | Status: AC
  Filled 2017-01-30: qty 200

## 2017-01-30 SURGICAL SUPPLY — 46 items
BLADE CLIPPER SURG (BLADE) IMPLANT
BLADE SURG 15 STRL LF DISP TIS (BLADE) ×1 IMPLANT
BLADE SURG 15 STRL SS (BLADE) ×1
BNDG GAUZE ELAST 4 BULKY (GAUZE/BANDAGES/DRESSINGS) ×2 IMPLANT
CANISTER SUCT 3000ML PPV (MISCELLANEOUS) IMPLANT
CANISTER SUCTION 1200CC (MISCELLANEOUS) IMPLANT
CLEANER CAUTERY TIP 5X5 PAD (MISCELLANEOUS) IMPLANT
COVER BACK TABLE 60X90IN (DRAPES) ×2 IMPLANT
COVER MAYO STAND STRL (DRAPES) ×2 IMPLANT
DISSECTOR ROUND CHERRY 3/8 STR (MISCELLANEOUS) IMPLANT
DRAIN PENROSE 18X1/4 LTX STRL (WOUND CARE) ×2 IMPLANT
DRAPE LAPAROTOMY 100X72 PEDS (DRAPES) ×2 IMPLANT
ELECT NEEDLE TIP 2.8 STRL (NEEDLE) IMPLANT
ELECT REM PT RETURN 9FT ADLT (ELECTROSURGICAL)
ELECTRODE REM PT RTRN 9FT ADLT (ELECTROSURGICAL) IMPLANT
GLOVE BIO SURGEON STRL SZ8 (GLOVE) IMPLANT
GLOVE BIOGEL PI IND STRL 6.5 (GLOVE) ×1 IMPLANT
GLOVE BIOGEL PI IND STRL 7.5 (GLOVE) ×1 IMPLANT
GLOVE BIOGEL PI INDICATOR 6.5 (GLOVE) ×1
GLOVE BIOGEL PI INDICATOR 7.5 (GLOVE) ×1
GLOVE SURG SS PI 6.0 STRL IVOR (GLOVE) ×2 IMPLANT
GLOVE SURG SS PI 7.0 STRL IVOR (GLOVE) ×2 IMPLANT
GLOVE SURG SS PI 7.5 STRL IVOR (GLOVE) ×2 IMPLANT
GOWN STRL REUS W/ TWL LRG LVL3 (GOWN DISPOSABLE) IMPLANT
GOWN STRL REUS W/ TWL XL LVL3 (GOWN DISPOSABLE) IMPLANT
GOWN STRL REUS W/TWL LRG LVL3 (GOWN DISPOSABLE) ×8 IMPLANT
GOWN STRL REUS W/TWL XL LVL3 (GOWN DISPOSABLE)
KIT RM TURNOVER CYSTO AR (KITS) ×2 IMPLANT
MANIFOLD NEPTUNE II (INSTRUMENTS) IMPLANT
NEEDLE HYPO 22GX1.5 SAFETY (NEEDLE) IMPLANT
NS IRRIG 500ML POUR BTL (IV SOLUTION) ×2 IMPLANT
PACK BASIN DAY SURGERY FS (CUSTOM PROCEDURE TRAY) ×2 IMPLANT
PAD CLEANER CAUTERY TIP 5X5 (MISCELLANEOUS)
PENCIL BUTTON HOLSTER BLD 10FT (ELECTRODE) ×2 IMPLANT
SUPPORT SCROTAL LG STRP (MISCELLANEOUS) ×2 IMPLANT
SUT CHROMIC 2 0 SH (SUTURE) IMPLANT
SUT CHROMIC 3 0 SH 27 (SUTURE) ×4 IMPLANT
SUT CHROMIC 4 0 SH 27 (SUTURE) IMPLANT
SUT MNCRL AB 4-0 PS2 18 (SUTURE) ×2 IMPLANT
SYR CONTROL 10ML LL (SYRINGE) IMPLANT
TOWEL NATURAL 6PK STERILE (DISPOSABLE) ×4 IMPLANT
TOWEL OR 17X24 6PK STRL BLUE (TOWEL DISPOSABLE) ×4 IMPLANT
TRAY DSU PREP LF (CUSTOM PROCEDURE TRAY) ×2 IMPLANT
TUBE CONNECTING 12X1/4 (SUCTIONS) ×2 IMPLANT
WATER STERILE IRR 500ML POUR (IV SOLUTION) ×2 IMPLANT
YANKAUER SUCT BULB TIP NO VENT (SUCTIONS) ×2 IMPLANT

## 2017-01-30 NOTE — Anesthesia Procedure Notes (Signed)
Procedure Name: LMA Insertion Date/Time: 01/30/2017 11:52 AM Performed by: Barnet Glasgow, MD Pre-anesthesia Checklist: Patient identified, Emergency Drugs available, Suction available and Patient being monitored Patient Re-evaluated:Patient Re-evaluated prior to induction Oxygen Delivery Method: Circle system utilized Preoxygenation: Pre-oxygenation with 100% oxygen Induction Type: IV induction Ventilation: Mask ventilation without difficulty LMA: LMA inserted LMA Size: 5.0 Number of attempts: 1 Airway Equipment and Method: Bite block Placement Confirmation: positive ETCO2 Tube secured with: Tape Dental Injury: Teeth and Oropharynx as per pre-operative assessment

## 2017-01-30 NOTE — Discharge Instructions (Signed)
Post Anesthesia Home Care Instructions  Activity: Get plenty of rest for the remainder of the day. A responsible individual must stay with you for 24 hours following the procedure.  For the next 24 hours, DO NOT: -Drive a car -Paediatric nurse -Drink alcoholic beverages -Take any medication unless instructed by your physician -Make any legal decisions or sign important papers.  Meals: Start with liquid foods such as gelatin or soup. Progress to regular foods as tolerated. Avoid greasy, spicy, heavy foods. If nausea and/or vomiting occur, drink only clear liquids until the nausea and/or vomiting subsides. Call your physician if vomiting continues.  Special Instructions/Symptoms: Your throat may feel dry or sore from the anesthesia or the breathing tube placed in your throat during surgery. If this causes discomfort, gargle with warm salt water. The discomfort should disappear within 24 hours.  If you had a scopolamine patch placed behind your ear for the management of post- operative nausea and/or vomiting:  1. The medication in the patch is effective for 72 hours, after which it should be removed.  Wrap patch in a tissue and discard in the trash. Wash hands thoroughly with soap and water. 2. You may remove the patch earlier than 72 hours if you experience unpleasant side effects which may include dry mouth, dizziness or visual disturbances. 3. Avoid touching the patch. Wash your hands with soap and water after contact with the patch.    Scrotal surgery postoperative instructions  Wound:  In most cases your incision will have absorbable sutures that will dissolve within the first 2-3 weeks. Some will fall out even earlier. Expect some redness as the sutures dissolve but this should occur only around the sutures. If there is generalized redness, especially with increasing pain or swelling, let us know. The scrotum will very likely get "black and blue" as the blood in the tissues spread.  Sometimes the whole scrotum will turn colors. The black and blue is followed by a yellow and brown color. In time, all the discoloration will go away. In some cases some firm swelling in the area of the testicle may persist for up to 4-6 weeks after the surgery and is considered normal in most cases.  Drain:  If the surgeon placed a drain through the bottom part of your scrotum, it is held in with a small stitch. When instructed ( in 1 week ), cut the small stitch and slide to drain out. Once the drain has been removed, a small hole made drain out for another day or 2. If so, keep a clean washcloth underneath your supportive undergarment, or sterile gauze. Until the hole seals up, all bathing should be in the shower, and not in the bathtub.  Diet:  You may return to your normal diet within 24 hours following your surgery. You may note some mild nausea and possibly vomiting the first 6-8 hours following surgery. This is usually due to the side effects of anesthesia, and will disappear quite soon. I would suggest clear liquids and a very light meal the first evening following your surgery.  Activity:  Your physical activity should be restricted the first 48 hours. During that time you should remain relatively inactive, moving about only when necessary. During the first 7-10 days following surgery you should avoid lifting any heavy objects (anything greater than 15 pounds), and avoid strenuous exercise. If you work, ask [place] specifically about your restrictions, both for work and home. We will write a note to your employer if needed.  You should plan to wear a tight pair of jockey shorts or an athletic supporter for the first 4-5 days, even to sleep. This will keep the scrotum immobilized to some degree and keep the swelling down. You may find it more comfortable to wear a support longer.  Ice packs should be placed on and off over the scrotum for the first 48 hours. Frozen peas or corn in a ZipLock bag  can be frozen, used and re-frozen. Fifteen minutes on and 15 minutes off is a reasonable schedule. The ice is a good pain reliever and keeps the swelling down.  Hygiene:  You may shower 48 hours after your surgery. Tub bathing should be restricted until the seventh day.          Medication:  You will be sent home with some type of pain medication. In many cases you will be sent home with a narcotic pain pill (Vicodin or Tylox). If the pain is not too bad, you may take either Tylenol (acetaminophen) or Advil (ibuprofen) which contain no narcotic agents, and might be tolerated a little better, with fewer side effects. If the pain medication you are sent home with does not control the pain, you will have to let us know. Some narcotic pain medications cannot be given or refilled by a phone call to a pharmacy.  Problems you should report to [place]:   Fever of 101.0 degrees Fahrenheit or greater.  Moderate or severe swelling under the skin incision or involving the scrotum.  Drug reaction such as hives, a rash, nausea or vomiting.

## 2017-01-30 NOTE — Op Note (Signed)
Preoperative diagnosis: Right hydrocele   Postoperative diagnosis: Same   Procedure:Right hydrocele repair   Surgeon: Lillette Boxer. Toryn Dewalt, M.D.   Anesthesia: Gen.   Indications: Patient presented with scrotal swelling. A hydrocele was confirmed with scrotal ultrasonography. The patient was symptomatic from his hydrocele and requested surgical intervention. He appeared to understand the risks benefits potential complications of this procedure.   Procedure: The patient was properly identified and marked in the holding room. He received preoperative IV antibiotics. He was then taken to the operating room where general anesthetic was administered using the LMA. The scrotum was then prepped and draped in the usual manner. Appropriate surgical timeout was performed. An incision was made in the median raphae of the scrotum. The hydrocele was encountered which was freed from the scrotal wall and then the testis and hydrocele sac were delivered from the incision. The hydrocele sac was then incised anteriorly and a large amount of fluid was obtained. The testis was carefully inspected and no other pathology was appreciated. The hydrocele sac was moderate in thickness.  The sac was then plicated posteriorly with a running 3-0 chromic suture. The spermatic cord block was then performed with quarter percent Marcaine. The testis was returned to the hemiscrotum taking great care to make sure that there was no twisting of the spermatic cord. Inspection of the inside of the scrotum revealed no significant bleeding. A rolled glove edge prepared by trimming it (because of a latex allergy, penrose was not used) was brought through the lower aspect of the scrotum into the affected hemiscrotum and sutured to the skin. The scrotal incision was then closed anatomically, first with a running 3-0 chromic reapproximating the dartos fascia, and then a 4-0 Monocryl used to close the skin in a simple running fashion. A fluff  dressing and jockstrap was then placed Sponge and needle counts were correct. No obvious complications occurred and the patient was brought to recovery room in stable condition.

## 2017-01-30 NOTE — Transfer of Care (Signed)
  Last Vitals:  Vitals:   01/30/17 0932 01/30/17 1242  BP: (!) 113/49 112/77  Pulse: (!) 50 63  Resp: 16 (!) 8  Temp: 36.5 C 36.7 C  SpO2: 98% 98%    Last Pain:  Vitals:   01/30/17 0932  TempSrc: Oral      Patients Stated Pain Goal: 5 (01/30/17 0950)  Immediate Anesthesia Transfer of Care Note  Patient: [name].  Procedure(s) Performed: Procedure(s) (LRB): HYDROCELECTOMY ADULT (Right)  Patient Location: PACU  Anesthesia Type: General  Level of Consciousness: awake, alert  and oriented  Airway & Oxygen Therapy: Patient Spontanous Breathing and Patient connected to nasal cannula oxygen  Post-op Assessment: Report given to PACU RN and Post -op Vital signs reviewed and stable  Post vital signs: Reviewed and stable  Complications: No apparent anesthesia complications

## 2017-01-30 NOTE — Anesthesia Postprocedure Evaluation (Signed)
Anesthesia Post Note  Patient: [name].  Procedure(s) Performed: HYDROCELECTOMY ADULT (Right )     Patient location during evaluation: PACU Anesthesia Type: General Level of consciousness: awake and alert Pain management: pain level controlled Vital Signs Assessment: post-procedure vital signs reviewed and stable Respiratory status: spontaneous breathing, nonlabored ventilation and respiratory function stable Cardiovascular status: blood pressure returned to baseline and stable Postop Assessment: no apparent nausea or vomiting Anesthetic complications: no    Last Vitals:  Vitals:   01/30/17 1245 01/30/17 1300  BP: 116/77 (!) 110/54  Pulse: (!) 58 68  Resp: 13 18  Temp:    SpO2: 99% 100%    Last Pain:  Vitals:   01/30/17 0932  TempSrc: Oral                 Barnet Glasgow

## 2017-01-30 NOTE — H&P (Signed)
H&P  Chief Complaint: right scrotal swelling  History of Present Illness: 57 year old male with a symptomatic right spermatocele/hydrocele.  This is quite bothersome, is uncomfortable, and oftentimes, because of lax scrotal skin, will get wet when he sits on the toilet. He desires surgical removal.  I've discussed this with the patient in the office.  He understands the process of hydrocelectomy/spermatocele ectomy, risks involved, and desires to proceed.  Past Medical History:  Diagnosis Date  . COPD, mild ([place])   . History of anal fissures   . History of colon polyps   . Right hydrocele   . Seasonal allergies     Past Surgical History:  Procedure Laterality Date  . COLONOSCOPY  last one 09-17-2016  . EVALUATION UNDER ANESTHESIA WITH ANAL FISTULECTOMY N/A 12/09/2012   Procedure: EXAM UNDER ANESTHESIA, FISTULOTOMY;  Surgeon: Leighton Ruff, MD;  Location: [facility];  Service: General;  Laterality: N/A;  . EXAMINATION UNDER ANESTHESIA N/A 08/03/2012   (anal fistula) placement of seton drain; Dr Autumn Messing   . EXCISION FORGEIN BODY AND GRANULOMA RIGHT INDEX FINGER  07-18-2005  . ORIF LEFT ANKLE FX  1981   HARDWARE REMOVED     Home Medications:    Allergies:  Allergies  Allergen Reactions  . Adhesive [Tape] Other (See Comments)    Skin blister red  . Latex Other (See Comments)    "skin irritated"  . Penicillins Rash    Family History  Problem Relation Age of Onset  . Cancer Sister        breast cancer  . Heart disease Mother        diastolic heart failure  . Diabetes Other   . Mental illness Other   . Cancer Other        breast, prostate  . Colon cancer Neg Hx   . Esophageal cancer Neg Hx   . Rectal cancer Neg Hx   . Stomach cancer Neg Hx     Social History:  reports that he quit smoking about 7 years ago. His smoking use included cigarettes. He has a 35.00 pack-year smoking history. he has never used smokeless tobacco. He reports that he drinks  about 3.6 oz of alcohol per week. He reports that he does not use drugs.  ROS: A complete review of systems was performed.  All systems are negative except for pertinent findings as noted.  Physical Exam:  Vital signs in last 24 hours: Temp:  [97.7 F (36.5 C)] 97.7 F (36.5 C) (01/11 0932) Pulse Rate:  [50] 50 (01/11 0932) Resp:  [16] 16 (01/11 0932) BP: (113)/(49) 113/49 (01/11 0932) SpO2:  [98 %] 98 % (01/11 0932) Weight:  [91.9 kg (202 lb 11.2 oz)] 91.9 kg (202 lb 11.2 oz) (01/11 0932) Constitutional:  Alert and oriented, No acute distress Cardiovascular: Regular rate and rhythm, No JVD Respiratory: Normal respiratory effort, Lungs clear bilaterally GI: Abdomen is soft, nontender, nondistended, no abdominal masses Genitourinary: No CVAT. Normal male phallus, right hydrocele noted Lymphatic: No lymphadenopathy Neurologic: Grossly intact, no focal deficits Psychiatric: Normal mood and affect  Laboratory Data:  No results for input(s): WBC, HGB, HCT, PLT in the last 72 hours.  No results for input(s): NA, K, CL, GLUCOSE, BUN, CALCIUM, CREATININE in the last 72 hours.  Invalid input(s): CO3   No results found for this or any previous visit (from the past 24 hour(s)). No results found for this or any previous visit (from the past 240 hour(s)).  Renal Function: No results  for input(s): CREATININE in the last 168 hours. CrCl cannot be calculated (Patient's most recent lab result is older than the maximum 21 days allowed.).  Radiologic Imaging: No results found.  Impression/Assessment:  Right hydrocele  Plan:  hydrocelectomy

## 2017-01-30 NOTE — Anesthesia Preprocedure Evaluation (Addendum)
Anesthesia Evaluation  Patient identified by MRN, date of birth, ID band Patient awake    Reviewed: Allergy & Precautions, NPO status , Patient's Chart, lab work & pertinent test results  Airway Mallampati: I  TM Distance: >3 FB Neck ROM: Full    Dental no notable dental hx.    Pulmonary neg pulmonary ROS, former smoker,    Pulmonary exam normal breath sounds clear to auscultation       Cardiovascular Exercise Tolerance: Good negative cardio ROS Normal cardiovascular exam Rhythm:Regular Rate:Normal     Neuro/Psych negative neurological ROS  negative psych ROS   GI/Hepatic negative GI ROS, Neg liver ROS,   Endo/Other  negative endocrine ROS  Renal/GU negative Renal ROS  negative genitourinary   Musculoskeletal negative musculoskeletal ROS (+)   Abdominal   Peds negative pediatric ROS (+)  Hematology negative hematology ROS (+)   Anesthesia Other Findings   Reproductive/Obstetrics                            Anesthesia Physical Anesthesia Plan  ASA: II  Anesthesia Plan: General   Post-op Pain Management:    Induction:   PONV Risk Score and Plan: 1 and Ondansetron, Treatment may vary due to age or medical condition and Dexamethasone  Airway Management Planned: LMA  Additional Equipment:   Intra-op Plan:   Post-operative Plan:   Informed Consent: I have reviewed the patients History and Physical, chart, labs and discussed the procedure including the risks, benefits and alternatives for the proposed anesthesia with the patient or authorized representative who has indicated his/her understanding and acceptance.   Dental advisory given  Plan Discussed with: CRNA and Anesthesiologist  Anesthesia Plan Comments:         Anesthesia Quick Evaluation

## 2017-02-02 ENCOUNTER — Encounter (HOSPITAL_BASED_OUTPATIENT_CLINIC_OR_DEPARTMENT_OTHER): Payer: Self-pay | Admitting: Urology

## 2017-02-05 LAB — POCT HEMOGLOBIN-HEMACUE: HEMOGLOBIN: 15.3 g/dL (ref 13.0–17.0)

## 2017-03-09 ENCOUNTER — Other Ambulatory Visit: Payer: Self-pay | Admitting: Family Medicine

## 2017-03-09 NOTE — Telephone Encounter (Signed)
Refill once.  Avoid regular use. 

## 2017-03-09 NOTE — Telephone Encounter (Signed)
Last refill 10/01/16 and last office visit 10/27/16.  Okay to fill?

## 2017-04-07 ENCOUNTER — Ambulatory Visit (INDEPENDENT_AMBULATORY_CARE_PROVIDER_SITE_OTHER): Payer: 59 | Admitting: Family Medicine

## 2017-04-07 ENCOUNTER — Encounter: Payer: Self-pay | Admitting: Family Medicine

## 2017-04-07 VITALS — BP 102/68 | HR 56 | Temp 98.3°F | Ht 73.0 in | Wt 200.5 lb

## 2017-04-07 DIAGNOSIS — Z0000 Encounter for general adult medical examination without abnormal findings: Secondary | ICD-10-CM

## 2017-04-07 LAB — CBC WITH DIFFERENTIAL/PLATELET
Basophils Absolute: 0.1 10*3/uL (ref 0.0–0.1)
Basophils Relative: 1.1 % (ref 0.0–3.0)
Eosinophils Absolute: 0.1 10*3/uL (ref 0.0–0.7)
Eosinophils Relative: 2.3 % (ref 0.0–5.0)
HCT: 42.9 % (ref 39.0–52.0)
Hemoglobin: 14.6 g/dL (ref 13.0–17.0)
LYMPHS ABS: 1.8 10*3/uL (ref 0.7–4.0)
Lymphocytes Relative: 29.4 % (ref 12.0–46.0)
MCHC: 34.1 g/dL (ref 30.0–36.0)
MCV: 93.3 fl (ref 78.0–100.0)
MONO ABS: 0.4 10*3/uL (ref 0.1–1.0)
MONOS PCT: 6.6 % (ref 3.0–12.0)
NEUTROS ABS: 3.8 10*3/uL (ref 1.4–7.7)
NEUTROS PCT: 60.6 % (ref 43.0–77.0)
PLATELETS: 207 10*3/uL (ref 150.0–400.0)
RBC: 4.6 Mil/uL (ref 4.22–5.81)
RDW: 14 % (ref 11.5–15.5)
WBC: 6.2 10*3/uL (ref 4.0–10.5)

## 2017-04-07 LAB — LIPID PANEL
CHOL/HDL RATIO: 3
Cholesterol: 185 mg/dL (ref 0–200)
HDL: 69.7 mg/dL (ref >39.00)
LDL Cholesterol: 106 mg/dL — ABNORMAL HIGH (ref 0–99)
NONHDL: 115.34
TRIGLYCERIDES: 49 mg/dL (ref 0.0–149.0)
VLDL: 9.8 mg/dL (ref 0.0–40.0)

## 2017-04-07 LAB — BASIC METABOLIC PANEL
BUN: 21 mg/dL (ref 6–23)
CALCIUM: 9.5 mg/dL (ref 8.4–10.5)
CHLORIDE: 102 meq/L (ref 96–112)
CO2: 31 meq/L (ref 19–32)
Creatinine, Ser: 0.9 mg/dL (ref 0.40–1.50)
GFR: 92.4 mL/min (ref >60.00)
Glucose, Bld: 99 mg/dL (ref 70–99)
POTASSIUM: 4.3 meq/L (ref 3.5–5.1)
SODIUM: 140 meq/L (ref 135–145)

## 2017-04-07 LAB — HEPATIC FUNCTION PANEL
ALT: 19 U/L (ref 0–53)
AST: 18 U/L (ref 0–37)
Albumin: 4.4 g/dL (ref 3.5–5.2)
Alkaline Phosphatase: 56 U/L (ref 39–117)
BILIRUBIN DIRECT: 0.1 mg/dL (ref 0.0–0.3)
TOTAL PROTEIN: 6.7 g/dL (ref 6.0–8.3)
Total Bilirubin: 0.5 mg/dL (ref 0.2–1.2)

## 2017-04-07 LAB — TSH: TSH: 1.32 u[IU]/mL (ref 0.35–4.50)

## 2017-04-07 LAB — PSA: PSA: 1.04 ng/mL (ref 0.10–4.00)

## 2017-04-07 NOTE — Progress Notes (Signed)
Subjective:     Patient ID: [name]., male   DOB: May 27, 1960, 57 y.o.   MRN: [id number]  HPI Patient seen for physical exam. Has done tremendous job in the past year with weight loss. In fact, he's lost around 60 pounds. He attributes this to reducing beer consumption, carbohydrate intake, and walking 2-3 miles per day. He feels much better overall. Takes no regular medications. He had prediabetes range blood sugars last year 115. No polyuria or polydipsia.  Patient quit smoking 2011. 35 pack year history. No recent cough. No dyspnea. No chest pains. Tetanus up-to-date. Colonoscopy up-to-date. No history of shingles vaccine but he had clinical shingles about 3 years ago  Past Medical History:  Diagnosis Date  . COPD, mild ([place])   . History of anal fissures   . History of colon polyps   . Right hydrocele   . Seasonal allergies    Past Surgical History:  Procedure Laterality Date  . COLONOSCOPY  last one 09-17-2016  . EVALUATION UNDER ANESTHESIA WITH ANAL FISTULECTOMY N/A 12/09/2012   Procedure: EXAM UNDER ANESTHESIA, FISTULOTOMY;  Surgeon: Leighton Ruff, MD;  Location: [facility];  Service: General;  Laterality: N/A;  . EXAMINATION UNDER ANESTHESIA N/A 08/03/2012   (anal fistula) placement of seton drain; Dr Autumn Messing   . EXCISION FORGEIN BODY AND GRANULOMA RIGHT INDEX FINGER  07-18-2005  . HYDROCELE EXCISION Right 01/30/2017   Procedure: HYDROCELECTOMY ADULT;  Surgeon: Franchot Gallo, MD;  Location: [facility];  Service: Urology;  Laterality: Right;  . ORIF LEFT ANKLE FX  1981   HARDWARE REMOVED     reports that he quit smoking about 7 years ago. His smoking use included cigarettes. He has a 35.00 pack-year smoking history. he has never used smokeless tobacco. He reports that he drinks about 3.6 oz of alcohol per week. He reports that he does not use drugs. family history includes Cancer in his other and sister; Diabetes in his other; Heart disease  in his mother; Mental illness in his other. Allergies  Allergen Reactions  . Adhesive [Tape] Other (See Comments)    Skin blister red  . Latex Other (See Comments)    "skin irritated"  . Penicillins Rash     Review of Systems  Constitutional: Negative for activity change, appetite change, fatigue and fever.  HENT: Negative for congestion, ear pain and trouble swallowing.   Eyes: Negative for pain and visual disturbance.  Respiratory: Negative for cough, shortness of breath and wheezing.   Cardiovascular: Negative for chest pain and palpitations.  Gastrointestinal: Negative for abdominal distention, abdominal pain, blood in stool, constipation, diarrhea, nausea, rectal pain and vomiting.  Endocrine: Negative for polydipsia and polyuria.  Genitourinary: Negative for dysuria, hematuria and testicular pain.  Musculoskeletal: Negative for arthralgias and joint swelling.  Skin: Negative for rash.  Neurological: Negative for dizziness, syncope and headaches.  Hematological: Negative for adenopathy.  Psychiatric/Behavioral: Negative for confusion and dysphoric mood.       Objective:   Physical Exam  Constitutional: He is oriented to person, place, and time. He appears well-developed and well-nourished. No distress.  HENT:  Head: Normocephalic and atraumatic.  Right Ear: External ear normal.  Left Ear: External ear normal.  Mouth/Throat: Oropharynx is clear and moist.  Eyes: Conjunctivae and EOM are normal. Pupils are equal, round, and reactive to light.  Neck: Normal range of motion. Neck supple. No thyromegaly present.  Cardiovascular: Normal rate, regular rhythm and normal heart sounds.  No murmur  heard. Pulmonary/Chest: No respiratory distress. He has no wheezes. He has no rales.  Abdominal: Soft. Bowel sounds are normal. He exhibits no distension and no mass. There is no tenderness. There is no rebound and no guarding.  Musculoskeletal: He exhibits no edema.  Lymphadenopathy:     He has no cervical adenopathy.  Neurological: He is alert and oriented to person, place, and time. He displays normal reflexes. No cranial nerve deficit.  Skin: No rash noted.  Psychiatric: He has a normal mood and affect.       Assessment:     Physical exam. Several issues addressed as below. He has done tremendous job with weight loss during past year due to lifestyle modification    Plan:     -Obtain screening labs -The natural history of prostate cancer and ongoing controversy regarding screening and potential treatment outcomes of prostate cancer has been discussed with the patient. The meaning of a false positive PSA and a false negative PSA has been discussed. He indicates understanding of the limitations of this screening test and wishes to proceed with screening PSA testing. -We discussed no shingles vaccine he will check on insurance coverage -Discussed low-dose [place] lung cancer screening program and he does have interest. He appears to qualify. Referral made -Discussed importance of maintenance of his weight loss  Eulas Post MD [facility]

## 2017-04-07 NOTE — Patient Instructions (Signed)
We will make referral to lung cancer screening program- which is through pulmonary.  Check on coverage for new shingles vaccine (Shingrix).

## 2017-04-09 ENCOUNTER — Other Ambulatory Visit: Payer: Self-pay | Admitting: Acute Care

## 2017-04-09 DIAGNOSIS — Z122 Encounter for screening for malignant neoplasm of respiratory organs: Secondary | ICD-10-CM

## 2017-04-09 DIAGNOSIS — Z87891 Personal history of nicotine dependence: Secondary | ICD-10-CM

## 2017-04-17 ENCOUNTER — Encounter: Payer: Self-pay | Admitting: Acute Care

## 2017-04-17 ENCOUNTER — Ambulatory Visit (INDEPENDENT_AMBULATORY_CARE_PROVIDER_SITE_OTHER)
Admission: RE | Admit: 2017-04-17 | Discharge: 2017-04-17 | Disposition: A | Discharge status: Home / Self Care | Payer: 59 | Source: Ambulatory Visit | Attending: Acute Care | Admitting: Acute Care

## 2017-04-17 ENCOUNTER — Ambulatory Visit (INDEPENDENT_AMBULATORY_CARE_PROVIDER_SITE_OTHER): Payer: 59 | Admitting: Acute Care

## 2017-04-17 DIAGNOSIS — Z87891 Personal history of nicotine dependence: Secondary | ICD-10-CM

## 2017-04-17 DIAGNOSIS — Z122 Encounter for screening for malignant neoplasm of respiratory organs: Secondary | ICD-10-CM

## 2017-04-17 NOTE — Progress Notes (Signed)
Shared Decision Making Visit Lung Cancer Screening Program [phone])   Eligibility:  Age 57 y.o.  Pack Years Smoking History Calculation 33 pack year smoking history (# packs/per year x # years smoked)  Recent History of coughing up blood  no  Unexplained weight loss? no ( >Than 15 pounds within the last 6 months )  Prior History Lung / other cancer no (Diagnosis within the last 5 years already requiring surveillance chest CT Scans).  Smoking Status Former Smoker  Former Smokers: Years since quit: 7 years  Quit Date: 12/18/2009  Visit Components:  Discussion included one or more decision making aids. yes  Discussion included risk/benefits of screening. yes  Discussion included potential follow up diagnostic testing for abnormal scans. yes  Discussion included meaning and risk of over diagnosis. yes  Discussion included meaning and risk of False Positives. yes  Discussion included meaning of total radiation exposure. yes  Counseling Included:  Importance of adherence to annual lung cancer LDCT screening. yes  Impact of comorbidities on ability to participate in the program. yes  Ability and willingness to under diagnostic treatment. yes  Smoking Cessation Counseling:  Current Smokers:   Discussed importance of smoking cessation. NA former smoker  Information about tobacco cessation classes and interventions provided to patient. yes  Patient provided with "ticket" for LDCT Scan. yes  Symptomatic Patient. no  Counseling NA  Diagnosis Code: Tobacco Use Z72.0  Asymptomatic Patient yes  Counseling (Intermediate counseling: > three minutes counseling) W2956  Former Smokers:   Discussed the importance of maintaining cigarette abstinence. yes  Diagnosis Code: Personal History of Nicotine Dependence. O13.086  Information about tobacco cessation classes and interventions provided to patient. Yes  Patient provided with "ticket" for LDCT Scan. yes  Written  Order for Lung Cancer Screening with LDCT placed in Epic. Yes (CT Chest Lung Cancer Screening Low Dose W/O CM) [id number] Z12.2-Screening of respiratory organs Z87.891-Personal history of nicotine dependence  I spent 25 minutes of face to face time with [name] discussing the risks and benefits of lung cancer screening. We viewed a power point together that explained in detail the above noted topics. We took the time to pause the power point at intervals to allow for questions to be asked and answered to ensure understanding. We discussed that he had taken the single most powerful action possible to decrease his risk of developing lung cancer when he quit smoking. I counseled him to remain smoke free, and to contact me if he ever had the desire to smoke again so that I can provide resources and tools to help support the effort to remain smoke free. We discussed the time and location of the scan, and that either  Doroteo Glassman RN or I will call with the results within  24-48 hours of receiving them. He has my card and contact information in the event he needs to speak with me, in addition to a copy of the power point we reviewed as a resource. He verbalized understanding of all of the above and had no further questions upon leaving the office.     I explained to the patient that there has been a high incidence of coronary artery disease noted on these exams. I explained that this is a non-gated exam therefore degree or severity cannot be determined. This patient is not on statin therapy. I have asked the patient to follow-up with their PCP regarding any incidental finding of coronary artery disease and management with diet or medication as they  feel is clinically indicated. The patient verbalized understanding of the above and had no further questions.     Magdalen Spatz, NP  04/17/2017 3:45 PM

## 2017-04-22 ENCOUNTER — Other Ambulatory Visit: Payer: Self-pay | Admitting: Acute Care

## 2017-04-22 DIAGNOSIS — Z87891 Personal history of nicotine dependence: Secondary | ICD-10-CM

## 2017-04-22 DIAGNOSIS — Z122 Encounter for screening for malignant neoplasm of respiratory organs: Secondary | ICD-10-CM

## 2017-07-02 ENCOUNTER — Other Ambulatory Visit: Payer: Self-pay | Admitting: Family Medicine

## 2017-07-03 NOTE — Telephone Encounter (Signed)
Last office visit 04/07/17 and last refill 03/09/17.  Okay to fill?

## 2017-07-06 NOTE — Telephone Encounter (Signed)
Refill once 

## 2017-07-06 NOTE — Telephone Encounter (Signed)
Last refill ws 03/09/17. Last physical was 04/07/17. Ok to refill?

## 2017-07-06 NOTE — Telephone Encounter (Signed)
Refill OK.  Try to avoid regular use.

## 2017-07-08 ENCOUNTER — Other Ambulatory Visit: Payer: Self-pay | Admitting: Family Medicine

## 2017-08-12 ENCOUNTER — Ambulatory Visit: Payer: 59 | Admitting: Family Medicine

## 2017-08-18 ENCOUNTER — Encounter: Payer: Self-pay | Admitting: Family Medicine

## 2017-09-16 ENCOUNTER — Other Ambulatory Visit: Payer: Self-pay | Admitting: Family Medicine

## 2017-09-17 NOTE — Telephone Encounter (Signed)
Last refill 07/07/17 and last office visit 04/07/17.  Okay to fill?

## 2017-09-21 NOTE — Telephone Encounter (Signed)
Refill once.  Avoid regular use. 

## 2017-09-22 ENCOUNTER — Other Ambulatory Visit: Payer: Self-pay | Admitting: Family Medicine

## 2017-10-12 ENCOUNTER — Ambulatory Visit (INDEPENDENT_AMBULATORY_CARE_PROVIDER_SITE_OTHER): Payer: 59 | Admitting: Family Medicine

## 2017-10-12 ENCOUNTER — Encounter: Payer: Self-pay | Admitting: *Deleted

## 2017-10-12 ENCOUNTER — Encounter: Payer: Self-pay | Admitting: Family Medicine

## 2017-10-12 VITALS — BP 100/62 | HR 74 | Temp 98.4°F | Ht 73.0 in | Wt 216.4 lb

## 2017-10-12 DIAGNOSIS — J029 Acute pharyngitis, unspecified: Secondary | ICD-10-CM | POA: Diagnosis not present

## 2017-10-12 DIAGNOSIS — R590 Localized enlarged lymph nodes: Secondary | ICD-10-CM

## 2017-10-12 DIAGNOSIS — J302 Other seasonal allergic rhinitis: Secondary | ICD-10-CM

## 2017-10-12 DIAGNOSIS — R059 Cough, unspecified: Secondary | ICD-10-CM

## 2017-10-12 DIAGNOSIS — R05 Cough: Secondary | ICD-10-CM

## 2017-10-12 LAB — POCT RAPID STREP A (OFFICE): Rapid Strep A Screen: NEGATIVE

## 2017-10-12 NOTE — Addendum Note (Signed)
Addended by: Lahoma Crocker A on: 10/12/2017 11:00 AM   Modules accepted: Orders

## 2017-10-12 NOTE — Progress Notes (Signed)
HPI:  Using dictation device. Unfortunately this device frequently misinterprets words/phrases.   Acute visit for respiratory illness: -started:3 days ago -symptoms:nasal congestion, sore throat, cough, PND -denies:fever, SOB, NVD, tooth pain -has tried: nothing -sick contacts/travel/risks: no reported flu, strep or tick exposure -Hx of: allergies, remote smoking hx  ROS: See pertinent positives and negatives per HPI.  Past Medical History:  Diagnosis Date  . COPD, mild ([place])   . History of anal fissures   . History of colon polyps   . Right hydrocele   . Seasonal allergies     Past Surgical History:  Procedure Laterality Date  . COLONOSCOPY  last one 09-17-2016  . EVALUATION UNDER ANESTHESIA WITH ANAL FISTULECTOMY N/A 12/09/2012   Procedure: EXAM UNDER ANESTHESIA, FISTULOTOMY;  Surgeon: Leighton Ruff, MD;  Location: [facility];  Service: General;  Laterality: N/A;  . EXAMINATION UNDER ANESTHESIA N/A 08/03/2012   (anal fistula) placement of seton drain; Dr Autumn Messing   . EXCISION FORGEIN BODY AND GRANULOMA RIGHT INDEX FINGER  07-18-2005  . HYDROCELE EXCISION Right 01/30/2017   Procedure: HYDROCELECTOMY ADULT;  Surgeon: Franchot Gallo, MD;  Location: [facility];  Service: Urology;  Laterality: Right;  . ORIF LEFT ANKLE FX  1981   HARDWARE REMOVED     Family History  Problem Relation Age of Onset  . Cancer Sister        breast cancer  . Heart disease Mother        diastolic heart failure  . Diabetes Other   . Mental illness Other   . Cancer Other        breast, prostate  . Colon cancer Neg Hx   . Esophageal cancer Neg Hx   . Rectal cancer Neg Hx   . Stomach cancer Neg Hx     Social History   Socioeconomic History  . Marital status: Married    Spouse name: Not on file  . Number of children: Not on file  . Years of education: Not on file  . Highest education level: Not on file  Occupational History  . Not on file  Social  Needs  . Financial resource strain: Not on file  . Food insecurity:    Worry: Not on file    Inability: Not on file  . Transportation needs:    Medical: Not on file    Non-medical: Not on file  Tobacco Use  . Smoking status: Former Smoker    Packs/day: 1.00    Years: 35.00    Pack years: 35.00    Types: Cigarettes    Last attempt to quit: 12/18/2009    Years since quitting: 7.8  . Smokeless tobacco: Never Used  Substance and Sexual Activity  . Alcohol use: Yes    Alcohol/week: 6.0 standard drinks    Types: 6 Cans of beer per week    Comment: occasional  . Drug use: No  . Sexual activity: Not on file  Lifestyle  . Physical activity:    Days per week: Not on file    Minutes per session: Not on file  . Stress: Not on file  Relationships  . Social connections:    Talks on phone: Not on file    Gets together: Not on file    Attends religious service: Not on file    Active member of club or organization: Not on file    Attends meetings of clubs or organizations: Not on file    Relationship status: Not  on file  Other Topics Concern  . Not on file  Social History Narrative  . Not on file     Current Outpatient Medications:  .  zolpidem (AMBIEN) 10 MG tablet, TAKE 1 TABLET BY MOUTH AT BEDTIME AS NEEDED FOR SLEEP, Disp: 30 tablet, Rfl: 0  EXAM:  Vitals:   10/12/17 1035  BP: 100/62  Pulse: 74  Temp: 98.4 F (36.9 C)  SpO2: 96%    Body mass index is 28.55 kg/m.  GENERAL: vitals reviewed and listed above, alert, oriented, appears well hydrated and in no acute distress  HEENT: atraumatic, conjunttiva clear, no obvious abnormalities on inspection of external nose and ears, normal appearance of ear canals and TMs, clear nasal congestion, mild post oropharyngeal erythema with PND, no tonsillar edema or exudate, no sinus TTP  NECK: no obvious masses on inspection, shotty LAD upper L ant cervical  LUNGS: clear to auscultation bilaterally, no wheezes, rales or rhonchi,  good air movement  CV: HRRR, no peripheral edema  MS: moves all extremities without noticeable abnormality  PSYCH: pleasant and cooperative, no obvious depression or anxiety  ASSESSMENT AND PLAN:  Discussed the following assessment and plan:  Sore throat  Cough  Seasonal allergies  LAD (lymphadenopathy) of left cervical region  -given HPI and exam findings today, a serious infection or illness is unlikely. We discussed potential etiologies, with seasonal allergies or VURI being most likely, and advised short course INS, antihistamine, supportive care and monitoring. We discussed treatment side effects, likely course, antibiotic misuse, transmission, and signs of developing a serious illness. -rapid strep neg -advised follow up 3 weeks to recheck LAD in neck to ensure resolution -of course, we advised to return or notify a doctor immediately if symptoms worsen or persist or new concerns arise.    Patient Instructions  BEFORE YOU LEAVE: -rapid strep test -follow up: 3 weeks for recheck lymph nodes neck  Flonase 2 sprays each nostril daily for 2 weeks  Claritin or zyrtec daily for a few weeks  I hope you are feeling better soon! Seek care promptly if your symptoms worsen, new concerns arise or you are not improving with treatment.      Lucretia Kern, DO

## 2017-10-12 NOTE — Patient Instructions (Signed)
BEFORE YOU LEAVE: -rapid strep test -follow up: 3 weeks for recheck lymph nodes neck  Flonase 2 sprays each nostril daily for 2 weeks  Claritin or zyrtec daily for a few weeks  I hope you are feeling better soon! Seek care promptly if your symptoms worsen, new concerns arise or you are not improving with treatment.

## 2017-10-15 ENCOUNTER — Telehealth: Payer: Self-pay | Admitting: Family Medicine

## 2017-10-15 NOTE — Telephone Encounter (Signed)
Spoke to wife - told her that Dr Elease Hashimoto was out of the office today.  I told her that it can take 7-10 business days for form completion, and we will call her when she can pick it up.  She verbalized understanding.

## 2017-10-15 NOTE — Telephone Encounter (Signed)
Copied from [place] [id number]. Topic: General - Other >> Oct 15, 2017  8:31 AM Yvette Rack wrote: Reason for CRM: pt wife Jackelyn Poling is faxing over a form to be filled out for employer health program [employer] screening to Dr Elease Hashimoto to fill out they would like to pick it up tomorrow >> Oct 15, 2017  8:37 AM Yvette Rack wrote: Pt wife Jackelyn Poling would like a call when the paper is filled out she lives a hour away please call at [phone] home  Cell # [phone]

## 2017-10-16 NOTE — Telephone Encounter (Signed)
Is this form on your desk?

## 2017-10-16 NOTE — Telephone Encounter (Signed)
I have not seen

## 2017-10-21 NOTE — Telephone Encounter (Signed)
Form signed and completed. Called patient and he stated that he will pick up this form on Friday at the front desk. Patient verified an understanding.

## 2017-11-10 ENCOUNTER — Ambulatory Visit (INDEPENDENT_AMBULATORY_CARE_PROVIDER_SITE_OTHER): Payer: 59 | Admitting: Family Medicine

## 2017-11-10 ENCOUNTER — Other Ambulatory Visit: Payer: Self-pay

## 2017-11-10 ENCOUNTER — Encounter: Payer: Self-pay | Admitting: Family Medicine

## 2017-11-10 VITALS — BP 120/76 | HR 68 | Temp 98.1°F | Ht 73.0 in | Wt 220.8 lb

## 2017-11-10 DIAGNOSIS — G4700 Insomnia, unspecified: Secondary | ICD-10-CM

## 2017-11-10 DIAGNOSIS — M79644 Pain in right finger(s): Secondary | ICD-10-CM

## 2017-11-10 MED ORDER — ZOLPIDEM TARTRATE 10 MG PO TABS
10.0000 mg | ORAL_TABLET | Freq: Every evening | ORAL | 2 refills | Status: DC | PRN
Start: 2017-11-10 — End: 2018-07-06

## 2017-11-10 NOTE — Progress Notes (Signed)
Subjective:     Patient ID: Carlos Michaels., male   DOB: 1960/10/11, 57 y.o.   MRN: [id number]  HPI Patient seen for the following issues  Right thumb pain.  He states back in July a [place] retriever basically "head butted" his right thumb and he had some pain involving the [facility] joint since then.  He did not noted any bruising.  No weakness.  He has pain with things like shaking hands and also with his work which requires frequent hand use with fine motor skills.  Occasional radiation to the wrist but most of his pain is confined to the [facility] joint of the right thumb.  He is left-hand dominant.  He has had some insomnia which he attributes mostly to third shift work.  He has tried Tylenol PM and melatonin without relief.  He takes Ambien as needed.  Rare alcohol use.  Avoids late the use of caffeine  Past Medical History:  Diagnosis Date  . COPD, mild ([place])   . History of anal fissures   . History of colon polyps   . Right hydrocele   . Seasonal allergies    Past Surgical History:  Procedure Laterality Date  . COLONOSCOPY  last one 09-17-2016  . EVALUATION UNDER ANESTHESIA WITH ANAL FISTULECTOMY N/A 12/09/2012   Procedure: EXAM UNDER ANESTHESIA, FISTULOTOMY;  Surgeon: Leighton Ruff, MD;  Location: [facility];  Service: General;  Laterality: N/A;  . EXAMINATION UNDER ANESTHESIA N/A 08/03/2012   (anal fistula) placement of seton drain; Dr Autumn Messing   . EXCISION FORGEIN BODY AND GRANULOMA RIGHT INDEX FINGER  07-18-2005  . HYDROCELE EXCISION Right 01/30/2017   Procedure: HYDROCELECTOMY ADULT;  Surgeon: Franchot Gallo, MD;  Location: [facility];  Service: Urology;  Laterality: Right;  . ORIF LEFT ANKLE FX  1981   HARDWARE REMOVED     reports that he quit smoking about 7 years ago. His smoking use included cigarettes. He has a 35.00 pack-year smoking history. He has never used smokeless tobacco. He reports that he drinks about 6.0 standard drinks of alcohol per  week. He reports that he does not use drugs. family history includes Cancer in his other and sister; Diabetes in his other; Heart disease in his mother; Mental illness in his other. Allergies  Allergen Reactions  . Adhesive [Tape] Other (See Comments)    Skin blister red  . Latex Other (See Comments)    "skin irritated"  . Penicillins Rash     Review of Systems  Cardiovascular: Negative for chest pain.  Neurological: Negative for weakness and numbness.  Psychiatric/Behavioral: Positive for sleep disturbance. Negative for dysphoric mood.       Objective:   Physical Exam  Constitutional: He appears well-developed and well-nourished.  Cardiovascular: Normal rate and regular rhythm.  Pulmonary/Chest: Effort normal and breath sounds normal.  Musculoskeletal:  Right thumb reveals full range of motion.  He has some minimal tenderness over the right CMC joint.  No warmth.  No erythema.  No ecchymosis.  Full range of motion right wrist.  No localized bony tenderness in the wrist region       Assessment:     #1 several month history of right thumb pain mostly CMC joint following injury  #2 insomnia    Plan:     -Given duration of symptoms we recommend he see hand surgeon for further evaluation. -Sleep hygiene discussed. -Refilled Ambien but try to avoid regular use.  We refilled 10 mg #30 with  2 refills.  Eulas Post MD [facility]

## 2017-11-10 NOTE — Patient Instructions (Signed)

## 2018-04-05 ENCOUNTER — Telehealth: Payer: Self-pay | Admitting: Acute Care

## 2018-04-05 NOTE — Telephone Encounter (Signed)
[name] has his CT appt scheduled for 05/07/18 @ 8:30am and he is aware of appt and location

## 2018-05-07 ENCOUNTER — Inpatient Hospital Stay: Admission: RE | Admit: 2018-05-07 | Payer: 59 | Source: Ambulatory Visit

## 2018-06-15 ENCOUNTER — Other Ambulatory Visit: Payer: Self-pay | Admitting: Acute Care

## 2018-06-15 DIAGNOSIS — Z87891 Personal history of nicotine dependence: Secondary | ICD-10-CM

## 2018-06-15 DIAGNOSIS — Z122 Encounter for screening for malignant neoplasm of respiratory organs: Secondary | ICD-10-CM

## 2018-07-02 ENCOUNTER — Other Ambulatory Visit: Payer: Self-pay | Admitting: Family Medicine

## 2018-07-05 NOTE — Telephone Encounter (Signed)
Last OV 11/10/17, No future OV  Last filled 11/10/17, # 30 with 2 refills

## 2018-07-16 ENCOUNTER — Ambulatory Visit (INDEPENDENT_AMBULATORY_CARE_PROVIDER_SITE_OTHER)
Admission: RE | Admit: 2018-07-16 | Discharge: 2018-07-16 | Disposition: A | Discharge status: Home / Self Care | Payer: 59 | Source: Ambulatory Visit | Attending: Acute Care | Admitting: Acute Care

## 2018-07-16 ENCOUNTER — Other Ambulatory Visit: Payer: Self-pay

## 2018-07-16 DIAGNOSIS — Z87891 Personal history of nicotine dependence: Secondary | ICD-10-CM

## 2018-07-16 DIAGNOSIS — Z122 Encounter for screening for malignant neoplasm of respiratory organs: Secondary | ICD-10-CM

## 2018-07-20 ENCOUNTER — Telehealth: Payer: Self-pay | Admitting: Acute Care

## 2018-07-22 NOTE — Telephone Encounter (Signed)
Called and spoke to patient. Relayed results per Eric Form, NP.  Patient receptive, pleased with results, and verbalized understanding.  No questions at this time. Nothing further needed.

## 2018-07-26 ENCOUNTER — Other Ambulatory Visit: Payer: Self-pay | Admitting: *Deleted

## 2018-07-26 DIAGNOSIS — Z122 Encounter for screening for malignant neoplasm of respiratory organs: Secondary | ICD-10-CM

## 2018-07-26 DIAGNOSIS — Z87891 Personal history of nicotine dependence: Secondary | ICD-10-CM

## 2018-08-31 ENCOUNTER — Telehealth: Payer: Self-pay | Admitting: Family Medicine

## 2018-08-31 MED ORDER — ZOLPIDEM TARTRATE 10 MG PO TABS: ORAL_TABLET | ORAL | 0 refills | Status: DC

## 2018-08-31 NOTE — Telephone Encounter (Signed)
See request °

## 2018-08-31 NOTE — Telephone Encounter (Signed)
Medication Refill - Medication: zolpidem (AMBIEN) 10 MG tablet New rx needs to be sen to new pharmacy   Has the patient contacted their pharmacy? No. (Agent: If no, request that the patient contact the pharmacy for the refill.) (Agent: If yes, when and what did the pharmacy advise?)  Preferred Pharmacy (with phone number or street name):  [id number] - [place], [place] [phone] (Phone) [phone] (Fax)     Agent: Please be advised that RX refills may take up to 3 business days. We ask that you follow-up with your pharmacy.

## 2018-08-31 NOTE — Telephone Encounter (Signed)
Refill of Ambien sent.

## 2018-08-31 NOTE — Telephone Encounter (Signed)
Last OV 11/10/17, No future OV  Last filled 07/06/18, # 30 with 0 refills

## 2018-10-26 ENCOUNTER — Other Ambulatory Visit: Payer: Self-pay | Admitting: Family Medicine

## 2018-10-27 NOTE — Telephone Encounter (Signed)
lvm pt needs appt to be filled

## 2018-10-27 NOTE — Telephone Encounter (Signed)
Patient returning call. Scheduled for CPE for 11/03/2018. Would like toknow if partial refill could be given since he will be out by 11/03/2018?

## 2018-11-03 ENCOUNTER — Other Ambulatory Visit: Payer: Self-pay

## 2018-11-03 ENCOUNTER — Encounter: Payer: Self-pay | Admitting: Family Medicine

## 2018-11-03 ENCOUNTER — Ambulatory Visit (INDEPENDENT_AMBULATORY_CARE_PROVIDER_SITE_OTHER): Payer: 59 | Admitting: Family Medicine

## 2018-11-03 VITALS — BP 106/70 | HR 59 | Temp 98.1°F | Ht 73.0 in | Wt 224.5 lb

## 2018-11-03 DIAGNOSIS — Z125 Encounter for screening for malignant neoplasm of prostate: Secondary | ICD-10-CM

## 2018-11-03 DIAGNOSIS — Z23 Encounter for immunization: Secondary | ICD-10-CM

## 2018-11-03 DIAGNOSIS — Z0000 Encounter for general adult medical examination without abnormal findings: Secondary | ICD-10-CM | POA: Diagnosis not present

## 2018-11-03 LAB — CBC WITH DIFFERENTIAL/PLATELET
Basophils Absolute: 0.1 10*3/uL (ref 0.0–0.1)
Basophils Relative: 1.1 % (ref 0.0–3.0)
Eosinophils Absolute: 0.2 10*3/uL (ref 0.0–0.7)
Eosinophils Relative: 2.8 % (ref 0.0–5.0)
HCT: 44.1 % (ref 39.0–52.0)
Hemoglobin: 14.8 g/dL (ref 13.0–17.0)
Lymphocytes Relative: 27.8 % (ref 12.0–46.0)
Lymphs Abs: 1.8 10*3/uL (ref 0.7–4.0)
MCHC: 33.6 g/dL (ref 30.0–36.0)
MCV: 93.4 fl (ref 78.0–100.0)
Monocytes Absolute: 0.5 10*3/uL (ref 0.1–1.0)
Monocytes Relative: 7.3 % (ref 3.0–12.0)
Neutro Abs: 3.9 10*3/uL (ref 1.4–7.7)
Neutrophils Relative %: 61 % (ref 43.0–77.0)
Platelets: 203 10*3/uL (ref 150.0–400.0)
RBC: 4.72 Mil/uL (ref 4.22–5.81)
RDW: 13.7 % (ref 11.5–15.5)
WBC: 6.3 10*3/uL (ref 4.0–10.5)

## 2018-11-03 LAB — HEPATIC FUNCTION PANEL
ALT: 21 U/L (ref 0–53)
AST: 20 U/L (ref 0–37)
Albumin: 4.5 g/dL (ref 3.5–5.2)
Alkaline Phosphatase: 53 U/L (ref 39–117)
Bilirubin, Direct: 0.1 mg/dL (ref 0.0–0.3)
Total Bilirubin: 0.6 mg/dL (ref 0.2–1.2)
Total Protein: 6.9 g/dL (ref 6.0–8.3)

## 2018-11-03 LAB — BASIC METABOLIC PANEL
BUN: 19 mg/dL (ref 6–23)
CO2: 31 mEq/L (ref 19–32)
Calcium: 9.4 mg/dL (ref 8.4–10.5)
Chloride: 103 mEq/L (ref 96–112)
Creatinine, Ser: 0.93 mg/dL (ref 0.40–1.50)
GFR: 83.25 mL/min (ref >60.00)
Glucose, Bld: 114 mg/dL — ABNORMAL HIGH (ref 70–99)
Potassium: 4.5 mEq/L (ref 3.5–5.1)
Sodium: 141 mEq/L (ref 135–145)

## 2018-11-03 LAB — LIPID PANEL
Cholesterol: 213 mg/dL — ABNORMAL HIGH (ref 0–200)
HDL: 64.1 mg/dL (ref >39.00)
LDL Cholesterol: 141 mg/dL — ABNORMAL HIGH (ref 0–99)
NonHDL: 148.89
Total CHOL/HDL Ratio: 3
Triglycerides: 41 mg/dL (ref 0.0–149.0)
VLDL: 8.2 mg/dL (ref 0.0–40.0)

## 2018-11-03 LAB — TSH: TSH: 1.26 u[IU]/mL (ref 0.35–4.50)

## 2018-11-03 LAB — PSA: PSA: 0.28 ng/mL (ref 0.10–4.00)

## 2018-11-03 NOTE — Progress Notes (Signed)
Subjective:     Patient ID: [name]., male   DOB: 1960/03/12, 58 y.o.   MRN: [id number]  HPI [name] is seen for physical exam.  Generally healthy.  He quit smoking 2011.  He is enrolled in the [place] lung cancer screening program and just had recent scan which was negative.  That was his second.  He is due for repeat colonoscopy 2023.  Needs tetanus.  Had flu vaccine yesterday.  No history of shingles vaccine.  Takes no regular medications.  Takes Ambien as needed for insomnia.  He has tried things like melatonin and Benadryl without relief  He has had past history of prediabetes.  He had lost down to 190 range but has gained some back this year  Wt Readings from Last 3 Encounters:  11/03/18 224 lb 8 oz (101.8 kg)  11/10/17 220 lb 12.8 oz (100.2 kg)  10/12/17 216 lb 6.4 oz (98.2 kg)     Past Medical History:  Diagnosis Date  . COPD, mild ([place])   . History of anal fissures   . History of colon polyps   . Right hydrocele   . Seasonal allergies    Past Surgical History:  Procedure Laterality Date  . COLONOSCOPY  last one 09-17-2016  . EVALUATION UNDER ANESTHESIA WITH ANAL FISTULECTOMY N/A 12/09/2012   Procedure: EXAM UNDER ANESTHESIA, FISTULOTOMY;  Surgeon: Leighton Ruff, MD;  [facility];  Service: General;  Laterality: N/A;  . EXAMINATION UNDER ANESTHESIA N/A 08/03/2012   (anal fistula) placement of seton drain; Dr Autumn Messing   . EXCISION FORGEIN BODY AND GRANULOMA RIGHT INDEX FINGER  07-18-2005  . HYDROCELE EXCISION Right 01/30/2017   Procedure: HYDROCELECTOMY ADULT;  Surgeon: Franchot Gallo, MD;  Location: [facility];  Service: Urology;  Laterality: Right;  . ORIF LEFT ANKLE FX  1981   HARDWARE REMOVED     reports that he quit smoking about 8 years ago. His smoking use included cigarettes. He has a 35.00 pack-year smoking history. He has never used smokeless tobacco. He reports current alcohol use of about 6.0 standard drinks of alcohol per  week. He reports that he does not use drugs. family history includes Cancer in his sister and another family member; Diabetes in an other family member; Heart disease in his mother; Mental illness in an other family member. Allergies  Allergen Reactions  . Adhesive [Tape] Other (See Comments)    Skin blister red  . Latex Other (See Comments)    "skin irritated"  . Penicillins Rash     Review of Systems  Constitutional: Negative for activity change, appetite change, fatigue and fever.  HENT: Negative for congestion, ear pain and trouble swallowing.   Eyes: Negative for pain and visual disturbance.  Respiratory: Negative for cough, shortness of breath and wheezing.   Cardiovascular: Negative for chest pain and palpitations.  Gastrointestinal: Negative for abdominal distention, abdominal pain, blood in stool, constipation, diarrhea, nausea, rectal pain and vomiting.  Endocrine: Negative for polydipsia and polyuria.  Genitourinary: Negative for dysuria, hematuria and testicular pain.  Musculoskeletal: Negative for arthralgias and joint swelling.  Skin: Negative for rash.  Neurological: Negative for dizziness, syncope and headaches.  Hematological: Negative for adenopathy.  Psychiatric/Behavioral: Negative for confusion and dysphoric mood.       Objective:   Physical Exam Constitutional:      General: He is not in acute distress.    Appearance: He is well-developed.  HENT:     Head: Normocephalic and  atraumatic.     Right Ear: External ear normal.     Left Ear: External ear normal.  Eyes:     Conjunctiva/sclera: Conjunctivae normal.     Pupils: Pupils are equal, round, and reactive to light.  Neck:     Musculoskeletal: Normal range of motion and neck supple.     Thyroid: No thyromegaly.  Cardiovascular:     Rate and Rhythm: Normal rate and regular rhythm.     Heart sounds: Normal heart sounds. No murmur.  Pulmonary:     Effort: No respiratory distress.     Breath sounds:  No wheezing or rales.  Abdominal:     General: Bowel sounds are normal. There is no distension.     Palpations: Abdomen is soft. There is no mass.     Tenderness: There is no abdominal tenderness. There is no guarding or rebound.  Lymphadenopathy:     Cervical: No cervical adenopathy.  Skin:    Findings: No rash.  Neurological:     Mental Status: He is alert and oriented to person, place, and time.     Cranial Nerves: No cranial nerve deficit.     Deep Tendon Reflexes: Reflexes normal.        Assessment:     Physical exam.  Patient is ex-smoker.  Multiple health maintenance issues addressed as below    Plan:     -Continue with yearly flu vaccine -Tetanus booster given -Discussed shingles vaccine and he will check on insurance coverage -Is encouraged to lose some weight -Get follow-up labs including PSA -Repeat colonoscopy in 3 years  Eulas Post MD [facility]

## 2018-11-03 NOTE — Patient Instructions (Signed)
Consider shingles vaccine (Shingrix) and check on insurance coverage if interested.   

## 2019-01-08 ENCOUNTER — Other Ambulatory Visit: Payer: Self-pay | Admitting: Family Medicine

## 2019-02-15 ENCOUNTER — Ambulatory Visit: Payer: 59 | Attending: Internal Medicine

## 2019-02-15 DIAGNOSIS — Z20822 Contact with and (suspected) exposure to covid-19: Secondary | ICD-10-CM

## 2019-02-16 LAB — NOVEL CORONAVIRUS, NAA: SARS-CoV-2, NAA: NOT DETECTED

## 2019-03-04 ENCOUNTER — Other Ambulatory Visit: Payer: Self-pay

## 2019-03-04 ENCOUNTER — Encounter: Payer: Self-pay | Admitting: Family Medicine

## 2019-03-04 ENCOUNTER — Ambulatory Visit (INDEPENDENT_AMBULATORY_CARE_PROVIDER_SITE_OTHER): Payer: 59 | Admitting: Family Medicine

## 2019-03-04 VITALS — BP 108/72 | HR 60 | Temp 97.2°F | Ht 73.0 in | Wt 223.4 lb

## 2019-03-04 DIAGNOSIS — M7751 Other enthesopathy of right foot: Secondary | ICD-10-CM

## 2019-03-04 NOTE — Patient Instructions (Signed)
Try some icing 20 minutes two to three times daily and continue brace support for now.

## 2019-03-04 NOTE — Progress Notes (Signed)
Subjective:     Patient ID: [name]., male   DOB: 06/25/1960, 59 y.o.   MRN: [id number]  HPI  Norris is seen with right anterior ankle pain.  He states about 2 weeks ago he was trying to push a heavy object weighing several 100 pounds and felt like his pain may have started after that.  He does not recall any inversion or eversion type ankle injury.  He has had some pain and swelling since then although he states symptoms are greatly improved over the past couple of days.  He did have some mild swelling initially.  No ecchymosis.  No lateral or medial ankle pain.  No sense of instability.  He has been wearing ankle brace  Past Medical History:  Diagnosis Date  . COPD, mild ([place])   . History of anal fissures   . History of colon polyps   . Right hydrocele   . Seasonal allergies    Past Surgical History:  Procedure Laterality Date  . COLONOSCOPY  last one 09-17-2016  . EVALUATION UNDER ANESTHESIA WITH ANAL FISTULECTOMY N/A 12/09/2012   Procedure: EXAM UNDER ANESTHESIA, FISTULOTOMY;  Surgeon: Leighton Ruff, MD;  Location: [facility];  Service: General;  Laterality: N/A;  . EXAMINATION UNDER ANESTHESIA N/A 08/03/2012   (anal fistula) placement of seton drain; Dr Autumn Messing   . EXCISION FORGEIN BODY AND GRANULOMA RIGHT INDEX FINGER  07-18-2005  . HYDROCELE EXCISION Right 01/30/2017   Procedure: HYDROCELECTOMY ADULT;  Surgeon: Franchot Gallo, MD;  Location: [facility];  Service: Urology;  Laterality: Right;  . ORIF LEFT ANKLE FX  1981   HARDWARE REMOVED     reports that he quit smoking about 9 years ago. His smoking use included cigarettes. He has a 35.00 pack-year smoking history. He has never used smokeless tobacco. He reports current alcohol use of about 6.0 standard drinks of alcohol per week. He reports that he does not use drugs. family history includes Cancer in his sister and another family member; Diabetes in an other family member; Heart disease in  his mother; Mental illness in an other family member. Allergies  Allergen Reactions  . Adhesive [Tape] Other (See Comments)    Skin blister red  . Latex Other (See Comments)    "skin irritated"  . Other Other (See Comments)    Skin blister red  . Penicillins Rash    Review of Systems  Neurological: Negative for weakness and numbness.       Objective:   Physical Exam Vitals reviewed.  Constitutional:      Appearance: Normal appearance.  Cardiovascular:     Rate and Rhythm: Normal rate and regular rhythm.  Musculoskeletal:     Comments: Right ankle and foot are examined.  He has no bony tenderness.  He has full range of motion right ankle.  No effusion.  No warmth.  No ecchymosis.  Mild anterior ankle tenderness over the tendons.  He has no pain with inversion or eversion.  Neurological:     Mental Status: He is alert.        Assessment:     Right anterior ankle pain.  Suspect tendinitis/tendinopathy pain    Plan:     -Recommend icing 15 to 20 minutes 2-3 times daily -Continue to wrap ankle or ankle brace as needed for any ankle support -We decided just to watch at this point since symptoms are improving  Eulas Post MD [facility] at [facility]

## 2019-03-08 ENCOUNTER — Other Ambulatory Visit: Payer: Self-pay | Admitting: Family Medicine

## 2019-03-09 NOTE — Telephone Encounter (Signed)
Will refill but would like for him to try to get off- or at least take very infrequently as there have been some concerns for possible memory loss with long term use

## 2019-05-04 ENCOUNTER — Other Ambulatory Visit: Payer: Self-pay | Admitting: Family Medicine

## 2019-05-04 NOTE — Telephone Encounter (Signed)
Last ov:03/04/19 Last filled:03/08/19

## 2019-07-11 ENCOUNTER — Other Ambulatory Visit: Payer: Self-pay | Admitting: Family Medicine

## 2019-07-11 NOTE — Telephone Encounter (Signed)
Please advise 

## 2019-07-19 ENCOUNTER — Other Ambulatory Visit: Payer: Self-pay

## 2019-07-19 ENCOUNTER — Ambulatory Visit (INDEPENDENT_AMBULATORY_CARE_PROVIDER_SITE_OTHER)
Admission: RE | Admit: 2019-07-19 | Discharge: 2019-07-19 | Disposition: A | Discharge status: Home / Self Care | Payer: 59 | Source: Ambulatory Visit | Attending: Acute Care | Admitting: Acute Care

## 2019-07-19 DIAGNOSIS — Z87891 Personal history of nicotine dependence: Secondary | ICD-10-CM | POA: Diagnosis not present

## 2019-07-19 DIAGNOSIS — Z122 Encounter for screening for malignant neoplasm of respiratory organs: Secondary | ICD-10-CM

## 2019-07-21 ENCOUNTER — Other Ambulatory Visit: Payer: Self-pay | Admitting: *Deleted

## 2019-07-21 DIAGNOSIS — Z87891 Personal history of nicotine dependence: Secondary | ICD-10-CM

## 2019-07-21 NOTE — Progress Notes (Signed)
Please call patient and let them  know their  low dose Ct was read as a Lung RADS 2: nodules that are benign in appearance and behavior with a very low likelihood of becoming a clinically active cancer due to size or lack of growth. Recommendation per radiology is for a repeat LDCT in 12 months. .Please let them  know we will order and schedule their  annual screening scan for 06/2020 Please let them  know there was notation of CAD on their  scan.  Please remind the patient  that this is a non-gated exam therefore degree or severity of disease  cannot be determined. Please have them  follow up with their PCP regarding potential risk factor modification, dietary therapy or pharmacologic therapy if clinically indicated. Pt.  is  not currently on statin therapy. Please place order for annual  screening scan for  06/2020 and fax results to PCP. Thanks so much. 

## 2019-07-27 ENCOUNTER — Encounter: Payer: Self-pay | Admitting: Adult Health

## 2019-07-27 ENCOUNTER — Other Ambulatory Visit: Payer: Self-pay

## 2019-07-27 ENCOUNTER — Telehealth (INDEPENDENT_AMBULATORY_CARE_PROVIDER_SITE_OTHER): Payer: Self-pay | Admitting: Adult Health

## 2019-07-27 VITALS — Temp 99.2°F | Wt 210.0 lb

## 2019-07-27 DIAGNOSIS — S30860A Insect bite (nonvenomous) of lower back and pelvis, initial encounter: Secondary | ICD-10-CM

## 2019-07-27 MED ORDER — DOXYCYCLINE HYCLATE 100 MG PO CAPS: 100.0000 mg | ORAL_CAPSULE | Freq: Two times a day (BID) | ORAL | 0 refills | Status: AC

## 2019-07-27 NOTE — Progress Notes (Addendum)
Virtual Visit via Telephone Note  I connected with [name]. on 07/27/19 at 11:00 AM EDT by telephone and verified that I am speaking with the correct person using two identifiers.   I discussed the limitations, risks, security and privacy concerns of performing an evaluation and management service by telephone and the availability of in person appointments. I also discussed with the patient that there may be a patient responsible charge related to this service. The patient expressed understanding and agreed to proceed.  Location patient: home Location provider: work or home office Participants present for the call: patient, provider Patient did not have a visit in the prior 7 days to address this/these issue(s).   History of Present Illness: 59 year old male who is being evaluated today via telephone visit for an acute complaint.  He reports that he woke up this morning with a frontal headache, low-grade fever up to 99.8 and a swollen gland on his right neck.  Additionally he reports that over the last few weeks he has pulled off 3 ticks from his buttocks and low back,  one was engorged and he does not know how long they were attached.  He denies rash, muscle pain,joint pain throughout his body,  blurred vision, nausea, vomiting, or diarrhea.   Observations/Objective: Patient sounds cheerful and well on the phone. I do not appreciate any SOB. Speech and thought processing are grossly intact. Patient reported vitals:  Assessment and Plan:  1. Insect bite (nonvenomous) of lower back and pelvis, initial encounter -Due to symptoms, will treat for tickborne illness with doxycycline 100 mg twice daily x14 days.  He was advised to follow-up with myself or his PCP if his symptoms worsen. - doxycycline (VIBRAMYCIN) 100 MG capsule; Take 1 capsule (100 mg total) by mouth 2 (two) times daily for 14 days.  Dispense: 28 capsule; Refill: 0   Follow Up Instructions:  I did not refer this patient  for an OV in the next 24 hours for this/these issue(s).  I discussed the assessment and treatment plan with the patient. The patient was provided an opportunity to ask questions and all were answered. The patient agreed with the plan and demonstrated an understanding of the instructions.   The patient was advised to call back or seek an in-person evaluation if the symptoms worsen or if the condition fails to improve as anticipated.  I provided 12 minutes of non-face-to-face time during this encounter.   Dorothyann Peng, NP

## 2019-07-28 ENCOUNTER — Telehealth: Payer: Self-pay | Admitting: Adult Health

## 2019-07-28 ENCOUNTER — Encounter: Payer: Self-pay | Admitting: Family Medicine

## 2019-07-28 NOTE — Telephone Encounter (Signed)
Pt stated he needs a work note from missing work yesterday. He did a virtual visit with Tommi Rumps and would like the note sent through [employer]

## 2019-07-28 NOTE — Telephone Encounter (Signed)
Letter has been released to [employer].  Nothing further needed.

## 2019-07-28 NOTE — Telephone Encounter (Signed)
Ok for work note? 

## 2019-09-02 ENCOUNTER — Other Ambulatory Visit: Payer: Self-pay | Admitting: Family Medicine

## 2019-09-02 NOTE — Telephone Encounter (Signed)
Please advise 

## 2019-10-27 ENCOUNTER — Other Ambulatory Visit: Payer: Self-pay | Admitting: Family Medicine

## 2019-12-09 ENCOUNTER — Other Ambulatory Visit: Payer: Self-pay | Admitting: Family Medicine

## 2020-04-06 ENCOUNTER — Other Ambulatory Visit: Payer: Self-pay | Admitting: Family Medicine

## 2020-04-06 NOTE — Telephone Encounter (Signed)
Pt call and stated he need a refill on zolpidem (AMBIEN) 10 MG tablet sent to  [id number] - [place], [place] Phone:  [phone]  Fax:  [phone]

## 2020-04-07 MED ORDER — ZOLPIDEM TARTRATE 10 MG PO TABS: ORAL_TABLET | ORAL | 1 refills | Status: DC

## 2020-07-02 ENCOUNTER — Other Ambulatory Visit: Payer: Self-pay

## 2020-07-02 ENCOUNTER — Encounter: Payer: Self-pay | Admitting: Family Medicine

## 2020-07-02 ENCOUNTER — Ambulatory Visit (INDEPENDENT_AMBULATORY_CARE_PROVIDER_SITE_OTHER): Payer: 59 | Admitting: Family Medicine

## 2020-07-02 VITALS — BP 134/70 | HR 70 | Temp 98.1°F | Ht 73.0 in | Wt 220.5 lb

## 2020-07-02 DIAGNOSIS — Z0000 Encounter for general adult medical examination without abnormal findings: Secondary | ICD-10-CM

## 2020-07-02 LAB — LIPID PANEL
Cholesterol: 189 mg/dL (ref 0–200)
HDL: 57.3 mg/dL (ref >39.00)
LDL Cholesterol: 119 mg/dL — ABNORMAL HIGH (ref 0–99)
NonHDL: 131.77
Total CHOL/HDL Ratio: 3
Triglycerides: 63 mg/dL (ref 0.0–149.0)
VLDL: 12.6 mg/dL (ref 0.0–40.0)

## 2020-07-02 LAB — CBC WITH DIFFERENTIAL/PLATELET
Basophils Absolute: 0.1 10*3/uL (ref 0.0–0.1)
Basophils Relative: 1.3 % (ref 0.0–3.0)
Eosinophils Absolute: 0.2 10*3/uL (ref 0.0–0.7)
Eosinophils Relative: 3.5 % (ref 0.0–5.0)
HCT: 46.9 % (ref 39.0–52.0)
Hemoglobin: 15.8 g/dL (ref 13.0–17.0)
Lymphocytes Relative: 31.3 % (ref 12.0–46.0)
Lymphs Abs: 2 10*3/uL (ref 0.7–4.0)
MCHC: 33.6 g/dL (ref 30.0–36.0)
MCV: 93.3 fl (ref 78.0–100.0)
Monocytes Absolute: 0.4 10*3/uL (ref 0.1–1.0)
Monocytes Relative: 6.6 % (ref 3.0–12.0)
Neutro Abs: 3.7 10*3/uL (ref 1.4–7.7)
Neutrophils Relative %: 57.3 % (ref 43.0–77.0)
Platelets: 190 10*3/uL (ref 150.0–400.0)
RBC: 5.03 Mil/uL (ref 4.22–5.81)
RDW: 14.3 % (ref 11.5–15.5)
WBC: 6.5 10*3/uL (ref 4.0–10.5)

## 2020-07-02 LAB — HEPATIC FUNCTION PANEL
ALT: 19 U/L (ref 0–53)
AST: 18 U/L (ref 0–37)
Albumin: 4.4 g/dL (ref 3.5–5.2)
Alkaline Phosphatase: 55 U/L (ref 39–117)
Bilirubin, Direct: 0.1 mg/dL (ref 0.0–0.3)
Total Bilirubin: 0.4 mg/dL (ref 0.2–1.2)
Total Protein: 6.9 g/dL (ref 6.0–8.3)

## 2020-07-02 LAB — BASIC METABOLIC PANEL
BUN: 18 mg/dL (ref 6–23)
CO2: 28 mEq/L (ref 19–32)
Calcium: 9.2 mg/dL (ref 8.4–10.5)
Chloride: 106 mEq/L (ref 96–112)
Creatinine, Ser: 0.94 mg/dL (ref 0.40–1.50)
GFR: 88.2 mL/min (ref >60.00)
Glucose, Bld: 106 mg/dL — ABNORMAL HIGH (ref 70–99)
Potassium: 4.5 mEq/L (ref 3.5–5.1)
Sodium: 141 mEq/L (ref 135–145)

## 2020-07-02 LAB — PSA: PSA: 0.21 ng/mL (ref 0.10–4.00)

## 2020-07-02 LAB — TSH: TSH: 1.33 u[IU]/mL (ref 0.35–4.50)

## 2020-07-02 LAB — HEMOGLOBIN A1C: Hgb A1c MFr Bld: 6 % (ref 4.6–6.5)

## 2020-07-02 NOTE — Patient Instructions (Signed)
Consider Shingrix vaccine.

## 2020-07-02 NOTE — Progress Notes (Signed)
Established Patient Office Visit  Subjective:  Patient ID: Carlos Blann., male    DOB: October 08, 1960  Age: 59 y.o. MRN: [id number]  CC:  Chief Complaint  Patient presents with   Annual Exam    No new concerns     HPI [name]. presents for physical exam.  Generally doing well.  No specific complaints today.  We reviewed health maintenance.  He has not had shingles vaccine.  Other vaccines are up-to-date.  Colonoscopy will be due next year.  Tetanus due 2030.  He has completed shingles vaccine though he did not have dates of first 2 shots.  Social history-he is married.  He has 1 stepson.  His wife was recently diagnosed with Alzheimer's disease.  Patient quit smoking 2011.  Only occasional alcohol use.  He is currently working for a [employer]  Family history-mother had diastolic heart failure.  Sister with breast cancer.  Father had pulmonary fibrosis.  No type 2 diabetes or premature CAD.  No other cancers.  Past Medical History:  Diagnosis Date   COPD, mild ([place])    History of anal fissures    History of colon polyps    Right hydrocele    Seasonal allergies     Past Surgical History:  Procedure Laterality Date   COLONOSCOPY  last one 09-17-2016   EVALUATION UNDER ANESTHESIA WITH ANAL FISTULECTOMY N/A 12/09/2012   Procedure: EXAM UNDER ANESTHESIA, FISTULOTOMY;  Surgeon: Leighton Ruff, MD;  Location[facility];  Service: General;  Laterality: N/A;   EXAMINATION UNDER ANESTHESIA N/A 08/03/2012   (anal fistula) placement of seton drain; Dr Autumn Messing    EXCISION [facility] BODY AND GRANULOMA RIGHT INDEX FINGER  07-18-2005   HYDROCELE EXCISION Right 01/30/2017   Procedure: HYDROCELECTOMY ADULT;  Surgeon: Franchot Gallo, MD;  Location: [facility];  Service: Urology;  Laterality: Right;   ORIF LEFT ANKLE FX  1981   HARDWARE REMOVED     Family History  Problem Relation Age of Onset   Heart disease Mother        diastolic heart failure    Pulmonary fibrosis Father    Cancer Sister        breast cancer   Diabetes Other    Mental illness Other    Cancer Other        breast, prostate   Colon cancer Neg Hx    Esophageal cancer Neg Hx    Rectal cancer Neg Hx    Stomach cancer Neg Hx     Social History   Socioeconomic History   Marital status: Married    Spouse name: Not on file   Number of children: Not on file   Years of education: Not on file   Highest education level: Not on file  Occupational History   Not on file  Tobacco Use   Smoking status: Former    Packs/day: 1.00    Years: 35.00    Pack years: 35.00    Types: Cigarettes    Quit date: 12/18/2009    Years since quitting: 10.5   Smokeless tobacco: Never  Vaping Use   Vaping Use: Never used  Substance and Sexual Activity   Alcohol use: Yes    Alcohol/week: 6.0 standard drinks    Types: 6 Cans of beer per week    Comment: occasional   Drug use: No   Sexual activity: Not on file  Other Topics Concern   Not on file  Social History  Narrative   Not on file   Social Determinants of Health   Financial Resource Strain: Not on file  Food Insecurity: Not on file  Transportation Needs: Not on file  Physical Activity: Not on file  Stress: Not on file  Social Connections: Not on file  Intimate Partner Violence: Not on file    Outpatient Medications Prior to Visit  Medication Sig Dispense Refill   zolpidem (AMBIEN) 10 MG tablet Take 1 tablet by mouth once daily at bedtime as needed for sleep. 30 tablet 1   No facility-administered medications prior to visit.    Allergies  Allergen Reactions   Adhesive [Tape] Other (See Comments)    Skin blister red   Latex Other (See Comments)    "skin irritated"   Other Other (See Comments)    Skin blister red   Penicillins Rash    ROS Review of Systems  Constitutional:  Negative for activity change, appetite change, fatigue and fever.  HENT:  Negative for congestion, ear pain and trouble swallowing.    Eyes:  Negative for pain and visual disturbance.  Respiratory:  Negative for cough, shortness of breath and wheezing.   Cardiovascular:  Negative for chest pain and palpitations.  Gastrointestinal:  Negative for abdominal distention, abdominal pain, blood in stool, constipation, diarrhea, nausea, rectal pain and vomiting.  Genitourinary:  Negative for dysuria, hematuria and testicular pain.  Musculoskeletal:  Negative for arthralgias and joint swelling.  Skin:  Negative for rash.  Neurological:  Negative for dizziness, syncope and headaches.  Hematological:  Negative for adenopathy.  Psychiatric/Behavioral:  Negative for confusion and dysphoric mood.      Objective:    Physical Exam Constitutional:      General: He is not in acute distress.    Appearance: He is well-developed.  HENT:     Head: Normocephalic and atraumatic.     Right Ear: External ear normal.     Left Ear: External ear normal.  Eyes:     Conjunctiva/sclera: Conjunctivae normal.     Pupils: Pupils are equal, round, and reactive to light.  Neck:     Thyroid: No thyromegaly.  Cardiovascular:     Rate and Rhythm: Normal rate and regular rhythm.     Heart sounds: Normal heart sounds. No murmur heard. Pulmonary:     Effort: No respiratory distress.     Breath sounds: No wheezing or rales.  Abdominal:     General: Bowel sounds are normal. There is no distension.     Palpations: Abdomen is soft. There is no mass.     Tenderness: There is no abdominal tenderness. There is no guarding or rebound.     Hernia: A hernia is present.     Comments: Does have umbilical hernia but soft and nontender  Musculoskeletal:     Cervical back: Normal range of motion and neck supple.  Lymphadenopathy:     Cervical: No cervical adenopathy.  Skin:    Findings: No rash.  Neurological:     Mental Status: He is alert and oriented to person, place, and time.     Cranial Nerves: No cranial nerve deficit.     Deep Tendon Reflexes:  Reflexes normal.    BP 134/70 (BP Location: Left Arm, Patient Position: Sitting, Cuff Size: Normal)   Pulse 70   Temp 98.1 F (36.7 C) (Oral)   Ht 6\' 1"  (1.854 m)   Wt 220 lb 8 oz (100 kg)   SpO2 96%   BMI 29.09 kg/m  Wt Readings from Last 3 Encounters:  07/02/20 220 lb 8 oz (100 kg)  07/27/19 210 lb (95.3 kg)  03/04/19 223 lb 6.4 oz (101.3 kg)     Health Maintenance Due  Topic Date Due   Zoster Vaccines- Shingrix (1 of 2) Never done   COVID-19 Vaccine (2 - Pfizer series) 06/22/2020    There are no preventive care reminders to display for this patient.  Lab Results  Component Value Date   TSH 1.26 11/03/2018   Lab Results  Component Value Date   WBC 6.3 11/03/2018   HGB 14.8 11/03/2018   HCT 44.1 11/03/2018   MCV 93.4 11/03/2018   PLT 203.0 11/03/2018   Lab Results  Component Value Date   NA 141 11/03/2018   K 4.5 11/03/2018   CO2 31 11/03/2018   GLUCOSE 114 (H) 11/03/2018   BUN 19 11/03/2018   CREATININE 0.93 11/03/2018   BILITOT 0.6 11/03/2018   ALKPHOS 53 11/03/2018   AST 20 11/03/2018   ALT 21 11/03/2018   PROT 6.9 11/03/2018   ALBUMIN 4.5 11/03/2018   CALCIUM 9.4 11/03/2018   GFR 83.25 11/03/2018   Lab Results  Component Value Date   CHOL 213 (H) 11/03/2018   Lab Results  Component Value Date   HDL 64.10 11/03/2018   Lab Results  Component Value Date   LDLCALC 141 (H) 11/03/2018   Lab Results  Component Value Date   TRIG 41.0 11/03/2018   Lab Results  Component Value Date   CHOLHDL 3 11/03/2018   No results found for: HGBA1C    Assessment & Plan:   Problem List Items Addressed This Visit   None Visit Diagnoses     Physical exam    -  Primary   Relevant Orders   Basic metabolic panel   Lipid panel   CBC with Differential/Platelet   TSH   Hepatic function panel   PSA   Hemoglobin A1c     -Obtain labs above -Recommend shingles vaccine.  Patient will consider getting this at pharmacy. -Repeat colonoscopy by next  year. -Continue annual flu vaccine -Pneumonia vaccine by age 81  No orders of the defined types were placed in this encounter.   Follow-up: No follow-ups on file.    Carolann Littler, MD

## 2020-07-13 ENCOUNTER — Other Ambulatory Visit: Payer: Self-pay | Admitting: *Deleted

## 2020-07-13 DIAGNOSIS — Z87891 Personal history of nicotine dependence: Secondary | ICD-10-CM

## 2020-07-25 ENCOUNTER — Other Ambulatory Visit: Payer: Self-pay

## 2020-07-25 ENCOUNTER — Ambulatory Visit (INDEPENDENT_AMBULATORY_CARE_PROVIDER_SITE_OTHER)
Admission: RE | Admit: 2020-07-25 | Discharge: 2020-07-25 | Disposition: A | Discharge status: Home / Self Care | Payer: 59 | Source: Ambulatory Visit | Attending: Acute Care | Admitting: Acute Care

## 2020-07-25 DIAGNOSIS — Z87891 Personal history of nicotine dependence: Secondary | ICD-10-CM

## 2020-07-27 NOTE — Progress Notes (Signed)
Please call patient and let them  know their  low dose Ct was read as a Lung RADS 2: nodules that are benign in appearance and behavior with a very low likelihood of becoming a clinically active cancer due to size or lack of growth. Recommendation per radiology is for a repeat LDCT in 12 months. .Please let them  know we will order and schedule their  annual screening scan for 07/2021. Please let them  know there was notation of CAD on their  scan.  Please remind the patient  that this is a non-gated exam therefore degree or severity of disease  cannot be determined. Please have them  follow up with their PCP regarding potential risk factor modification, dietary therapy or pharmacologic therapy if clinically indicated. Pt.  is not  currently on statin therapy. Please place order for annual  screening scan for  07/2021 and fax results to PCP. Thanks so much.  There was notation of aortic atherosclerosis, No statin and no echo. Have her follow up with PCP. Thanks

## 2020-07-30 ENCOUNTER — Encounter: Payer: Self-pay | Admitting: *Deleted

## 2020-07-30 DIAGNOSIS — Z87891 Personal history of nicotine dependence: Secondary | ICD-10-CM

## 2020-08-01 ENCOUNTER — Other Ambulatory Visit: Payer: Self-pay

## 2020-08-01 ENCOUNTER — Ambulatory Visit (INDEPENDENT_AMBULATORY_CARE_PROVIDER_SITE_OTHER): Payer: 59 | Admitting: Family Medicine

## 2020-08-01 VITALS — BP 122/65 | HR 65 | Temp 98.3°F | Wt 215.7 lb

## 2020-08-01 DIAGNOSIS — E785 Hyperlipidemia, unspecified: Secondary | ICD-10-CM | POA: Diagnosis not present

## 2020-08-01 DIAGNOSIS — I700 Atherosclerosis of aorta: Secondary | ICD-10-CM

## 2020-08-01 NOTE — Progress Notes (Signed)
Established Patient Office Visit  Subjective:  Patient ID: [name]., male    DOB: 1960-12-15  Age: 60 y.o. MRN: [id number]  CC:  Chief Complaint  Patient presents with   Follow-up    Discuss statin medications     HPI [name]. presents for further discussion regarding possible statin use.  He had recent complete physical and had LDL cholesterol 119.  10-year calculated CAD risk of 6.8%.  Quit smoking several years ago.  He and his wife have made some dietary changes and tried to reduce saturated fats.  His father did have coronary disease but was well up in age and no family history of premature CAD.  He had recent low-dose CT lung cancer screening and this showed benign-appearing nodules with recommended 60-month follow-up.  There was also comment of CAD on scan and notation of aortic atherosclerosis.  Patient denies any recent chest pains.  He remains reluctant to go on statin at this time.  Quit smoking 2011.  35-pack-year history.  Past Medical History:  Diagnosis Date   COPD, mild ([place])    History of anal fissures    History of colon polyps    Right hydrocele    Seasonal allergies     Past Surgical History:  Procedure Laterality Date   COLONOSCOPY  last one 09-17-2016   EVALUATION UNDER ANESTHESIA WITH ANAL FISTULECTOMY N/A 12/09/2012   Procedure: EXAM UNDER ANESTHESIA, FISTULOTOMY;  Surgeon: Leighton Ruff, MD;  Location: [facility];  Service: General;  Laterality: N/A;   EXAMINATION UNDER ANESTHESIA N/A 08/03/2012   (anal fistula) placement of seton drain; Dr Autumn Messing    EXCISION [facility] BODY AND GRANULOMA RIGHT INDEX FINGER  07-18-2005   HYDROCELE EXCISION Right 01/30/2017   Procedure: HYDROCELECTOMY ADULT;  Surgeon: Franchot Gallo, MD;  [facility];  Service: Urology;  Laterality: Right;   ORIF LEFT ANKLE FX  1981   HARDWARE REMOVED     Family History  Problem Relation Age of Onset   Heart disease Mother         diastolic heart failure   Pulmonary fibrosis Father    Cancer Sister        breast cancer   Diabetes Other    Mental illness Other    Cancer Other        breast, prostate   Colon cancer Neg Hx    Esophageal cancer Neg Hx    Rectal cancer Neg Hx    Stomach cancer Neg Hx     Social History   Socioeconomic History   Marital status: Married    Spouse name: Not on file   Number of children: Not on file   Years of education: Not on file   Highest education level: Not on file  Occupational History   Not on file  Tobacco Use   Smoking status: Former    Packs/day: 1.00    Years: 35.00    Pack years: 35.00    Types: Cigarettes    Quit date: 12/18/2009    Years since quitting: 10.6   Smokeless tobacco: Never  Vaping Use   Vaping Use: Never used  Substance and Sexual Activity   Alcohol use: Yes    Alcohol/week: 6.0 standard drinks    Types: 6 Cans of beer per week    Comment: occasional   Drug use: No   Sexual activity: Not on file  Other Topics Concern   Not on file  Social History  Narrative   Not on file   Social Determinants of Health   Financial Resource Strain: Not on file  Food Insecurity: Not on file  Transportation Needs: Not on file  Physical Activity: Not on file  Stress: Not on file  Social Connections: Not on file  Intimate Partner Violence: Not on file    Outpatient Medications Prior to Visit  Medication Sig Dispense Refill   zolpidem (AMBIEN) 10 MG tablet Take 1 tablet by mouth once daily at bedtime as needed for sleep. 30 tablet 1   No facility-administered medications prior to visit.    Allergies  Allergen Reactions   Adhesive [Tape] Other (See Comments)    Skin blister red   Latex Other (See Comments)    "skin irritated"   Other Other (See Comments)    Skin blister red   Penicillins Rash    ROS Review of Systems  Constitutional:  Negative for fatigue.  Respiratory:  Negative for cough, chest tightness and shortness of breath.    Cardiovascular:  Negative for chest pain, palpitations and leg swelling.  Neurological:  Negative for dizziness, syncope, weakness, light-headedness and headaches.     Objective:    Physical Exam Vitals reviewed.  Cardiovascular:     Rate and Rhythm: Normal rate and regular rhythm.    BP 122/65 (BP Location: Left Arm, Patient Position: Sitting, Cuff Size: Normal)   Pulse 65   Temp 98.3 F (36.8 C) (Oral)   Wt 215 lb 11.2 oz (97.8 kg)   SpO2 95%   BMI 28.46 kg/m  Wt Readings from Last 3 Encounters:  08/01/20 215 lb 11.2 oz (97.8 kg)  07/02/20 220 lb 8 oz (100 kg)  07/27/19 210 lb (95.3 kg)     Health Maintenance Due  Topic Date Due   Zoster Vaccines- Shingrix (1 of 2) Never done   COVID-19 Vaccine (2 - Pfizer series) 06/22/2020    There are no preventive care reminders to display for this patient.  Lab Results  Component Value Date   TSH 1.33 07/02/2020   Lab Results  Component Value Date   WBC 6.5 07/02/2020   HGB 15.8 07/02/2020   HCT 46.9 07/02/2020   MCV 93.3 07/02/2020   PLT 190.0 07/02/2020   Lab Results  Component Value Date   NA 141 07/02/2020   K 4.5 07/02/2020   CO2 28 07/02/2020   GLUCOSE 106 (H) 07/02/2020   BUN 18 07/02/2020   CREATININE 0.94 07/02/2020   BILITOT 0.4 07/02/2020   ALKPHOS 55 07/02/2020   AST 18 07/02/2020   ALT 19 07/02/2020   PROT 6.9 07/02/2020   ALBUMIN 4.4 07/02/2020   CALCIUM 9.2 07/02/2020   GFR 88.20 07/02/2020   Lab Results  Component Value Date   CHOL 189 07/02/2020   Lab Results  Component Value Date   HDL 57.30 07/02/2020   Lab Results  Component Value Date   LDLCALC 119 (H) 07/02/2020   Lab Results  Component Value Date   TRIG 63.0 07/02/2020   Lab Results  Component Value Date   CHOLHDL 3 07/02/2020   Lab Results  Component Value Date   HGBA1C 6.0 07/02/2020      Assessment & Plan:   Recent low-dose CT lung cancer screen with mention of "aortic atherosclerosis" and "CAD ".  10-year  calculated risk of CAD per American Heart Association calculator 6.8%.  Patient reluctant to consider statin therapy at this time.  We did discuss further restratification with coronary calcium scan  and he would like to proceed in that direction.  He has not ruled out possibility of statin use if score comes back high  No orders of the defined types were placed in this encounter.   Follow-up: No follow-ups on file.    Carolann Littler, MD

## 2020-08-09 ENCOUNTER — Other Ambulatory Visit: Payer: Self-pay | Admitting: Family Medicine

## 2020-08-09 NOTE — Telephone Encounter (Signed)
Last filled 04/07/2020 Last OV 08/01/2020  Ok to fill?

## 2020-09-03 ENCOUNTER — Other Ambulatory Visit: Payer: 59

## 2020-09-11 ENCOUNTER — Other Ambulatory Visit: Payer: Self-pay

## 2020-09-11 ENCOUNTER — Ambulatory Visit (INDEPENDENT_AMBULATORY_CARE_PROVIDER_SITE_OTHER)
Admission: RE | Admit: 2020-09-11 | Discharge: 2020-09-11 | Disposition: A | Discharge status: Home / Self Care | Payer: Self-pay | Source: Ambulatory Visit | Attending: Family Medicine | Admitting: Family Medicine

## 2020-09-11 DIAGNOSIS — I700 Atherosclerosis of aorta: Secondary | ICD-10-CM

## 2020-09-21 ENCOUNTER — Other Ambulatory Visit: Payer: Self-pay

## 2020-09-21 ENCOUNTER — Ambulatory Visit (INDEPENDENT_AMBULATORY_CARE_PROVIDER_SITE_OTHER): Payer: 59 | Admitting: Family Medicine

## 2020-09-21 ENCOUNTER — Encounter: Payer: Self-pay | Admitting: Family Medicine

## 2020-09-21 ENCOUNTER — Telehealth: Payer: Self-pay

## 2020-09-21 VITALS — BP 128/80 | HR 70 | Temp 98.1°F | Ht 73.0 in | Wt 215.0 lb

## 2020-09-21 DIAGNOSIS — A084 Viral intestinal infection, unspecified: Secondary | ICD-10-CM

## 2020-09-21 MED ORDER — ONDANSETRON 8 MG PO TBDP: 8.0000 mg | ORAL_TABLET | Freq: Three times a day (TID) | ORAL | 0 refills | Status: DC | PRN

## 2020-09-21 NOTE — Telephone Encounter (Signed)
I called and spoke with patient's wife. Patient was mainly throwing up over night, but has thrown up once this morning. He is not having any dizziness, and they Sy't feel as if he does need fluids. They will keep their appointment for 1:30 this afternoon in office.

## 2020-09-21 NOTE — Progress Notes (Signed)
Established Patient Office Visit  Subjective:  Patient ID: Carlos Chann., male    DOB: Aug 22, 1960  Age: 60 y.o. MRN: [id number]  CC:  Chief Complaint  Patient presents with   Vomiting    Started around midnight, no fevers.    Abdominal Pain   Diarrhea    HPI [name]. presents for acute onset around midnight last night of nausea, vomiting, diarrhea.  Actually feels somewhat better now.  Denies any fever.  No known sick contacts.  He was having very frequent diarrhea every 10 to 15 minutes during the early morning hours but not at this time.  Has kept down some yogurt and fluids past couple hours.  Has had some fleeting crampy abdominal pains but no localizing pain.  No dizziness.  No upper respiratory symptoms.  Past Medical History:  Diagnosis Date   COPD, mild ([place])    History of anal fissures    History of colon polyps    Right hydrocele    Seasonal allergies     Past Surgical History:  Procedure Laterality Date   COLONOSCOPY  last one 09-17-2016   EVALUATION UNDER ANESTHESIA WITH ANAL FISTULECTOMY N/A 12/09/2012   Procedure: EXAM UNDER ANESTHESIA, FISTULOTOMY;  Surgeon: Leighton Ruff, MD;  Location: [facility];  Service: General;  Laterality: N/A;   EXAMINATION UNDER ANESTHESIA N/A 08/03/2012   (anal fistula) placement of seton drain; Dr Autumn Messing    EXCISION [facility] BODY AND GRANULOMA RIGHT INDEX FINGER  07-18-2005   HYDROCELE EXCISION Right 01/30/2017   Procedure: HYDROCELECTOMY ADULT;  Surgeon: Franchot Gallo, MD;  Location: [facility];  Service: Urology;  Laterality: Right;   ORIF LEFT ANKLE FX  1981   HARDWARE REMOVED     Family History  Problem Relation Age of Onset   Heart disease Mother        diastolic heart failure   Pulmonary fibrosis Father    Cancer Sister        breast cancer   Diabetes Other    Mental illness Other    Cancer Other        breast, prostate   Colon cancer Neg Hx    Esophageal cancer Neg Hx     Rectal cancer Neg Hx    Stomach cancer Neg Hx     Social History   Socioeconomic History   Marital status: Married    Spouse name: Not on file   Number of children: Not on file   Years of education: Not on file   Highest education level: Not on file  Occupational History   Not on file  Tobacco Use   Smoking status: Former    Packs/day: 1.00    Years: 35.00    Pack years: 35.00    Types: Cigarettes    Quit date: 12/18/2009    Years since quitting: 10.7   Smokeless tobacco: Never  Vaping Use   Vaping Use: Never used  Substance and Sexual Activity   Alcohol use: Yes    Alcohol/week: 6.0 standard drinks    Types: 6 Cans of beer per week    Comment: occasional   Drug use: No   Sexual activity: Not on file  Other Topics Concern   Not on file  Social History Narrative   Not on file   Social Determinants of Health   Financial Resource Strain: Not on file  Food Insecurity: Not on file  Transportation Needs: Not on file  Physical Activity: Not  on file  Stress: Not on file  Social Connections: Not on file  Intimate Partner Violence: Not on file    Outpatient Medications Prior to Visit  Medication Sig Dispense Refill   zolpidem (AMBIEN) 10 MG tablet TAKE 1 TABLET BY MOUTH ONCE DAILY AT BEDTIME AS NEEDED FOR SLEEP 30 tablet 1   No facility-administered medications prior to visit.    Allergies  Allergen Reactions   Adhesive [Tape] Other (See Comments)    Skin blister red   Latex Other (See Comments)    "skin irritated"   Other Other (See Comments)    Skin blister red   Penicillins Rash    ROS Review of Systems  Constitutional:  Negative for chills and fever.  Respiratory:  Negative for cough.   Cardiovascular:  Negative for chest pain.  Gastrointestinal:  Positive for diarrhea, nausea and vomiting.  Genitourinary:  Negative for dysuria.     Objective:    Physical Exam Vitals reviewed.  Constitutional:      Appearance: He is well-developed.   Cardiovascular:     Rate and Rhythm: Normal rate and regular rhythm.  Abdominal:     General: Abdomen is flat. Bowel sounds are normal. There is no distension.     Palpations: Abdomen is soft.     Tenderness: There is no abdominal tenderness. There is no guarding or rebound.  Neurological:     Mental Status: He is alert.    BP 128/80   Pulse 70   Temp 98.1 F (36.7 C) (Oral)   Ht '6\' 1"'$  (1.854 m)   Wt 215 lb (97.5 kg)   SpO2 97%   BMI 28.37 kg/m  Wt Readings from Last 3 Encounters:  09/21/20 215 lb (97.5 kg)  08/01/20 215 lb 11.2 oz (97.8 kg)  07/02/20 220 lb 8 oz (100 kg)     Health Maintenance Due  Topic Date Due   Zoster Vaccines- Shingrix (1 of 2) Never done   COVID-19 Vaccine (2 - Pfizer series) 06/22/2020   INFLUENZA VACCINE  08/20/2020    There are no preventive care reminders to display for this patient.  Lab Results  Component Value Date   TSH 1.33 07/02/2020   Lab Results  Component Value Date   WBC 6.5 07/02/2020   HGB 15.8 07/02/2020   HCT 46.9 07/02/2020   MCV 93.3 07/02/2020   PLT 190.0 07/02/2020   Lab Results  Component Value Date   NA 141 07/02/2020   K 4.5 07/02/2020   CO2 28 07/02/2020   GLUCOSE 106 (H) 07/02/2020   BUN 18 07/02/2020   CREATININE 0.94 07/02/2020   BILITOT 0.4 07/02/2020   ALKPHOS 55 07/02/2020   AST 18 07/02/2020   ALT 19 07/02/2020   PROT 6.9 07/02/2020   ALBUMIN 4.4 07/02/2020   CALCIUM 9.2 07/02/2020   GFR 88.20 07/02/2020   Lab Results  Component Value Date   CHOL 189 07/02/2020   Lab Results  Component Value Date   HDL 57.30 07/02/2020   Lab Results  Component Value Date   LDLCALC 119 (H) 07/02/2020   Lab Results  Component Value Date   TRIG 63.0 07/02/2020   Lab Results  Component Value Date   CHOLHDL 3 07/02/2020   Lab Results  Component Value Date   HGBA1C 6.0 07/02/2020      Assessment & Plan:   Patient presents with acute onset of nausea, vomiting, diarrhea.  This started around  midnight and symptoms improving at this time.  Suspect acute viral gastroenteritis.  No sick contacts.  Patient in no distress and appears nontoxic at this time.  Does not have any low blood pressure or significant tachycardias to suggest severe dehydration  -Recommend bland diet and increase fluid intake as tolerated -We did write for some Zofran 8 mg ODT 1 every 8 hours as needed for any recurrent nausea or vomiting -Follow-up for any persistent vomiting or other concerns.  We discussed checking possible electrolytes but since that he is keeping down fluids well at this point have decided against  Meds ordered this encounter  Medications   ondansetron (ZOFRAN ODT) 8 MG disintegrating tablet    Sig: Take 1 tablet (8 mg total) by mouth every 8 (eight) hours as needed for nausea or vomiting.    Dispense:  10 tablet    Refill:  0    Follow-up: No follow-ups on file.    Carolann Littler, MD

## 2020-09-21 NOTE — Telephone Encounter (Signed)
Wife called to schedule appt pt has 3 symptoms from covid screening she wants to know if the appt will be in office or virtual I told her someone would call to confirm

## 2020-09-23 ENCOUNTER — Other Ambulatory Visit: Payer: Self-pay

## 2020-09-23 ENCOUNTER — Encounter (HOSPITAL_COMMUNITY): Payer: Self-pay

## 2020-09-23 ENCOUNTER — Emergency Department (HOSPITAL_COMMUNITY)
Admission: EM | Admit: 2020-09-23 | Discharge: 2020-09-23 | Disposition: A | Discharge status: Home / Self Care | Payer: 59 | Attending: Physician Assistant | Admitting: Physician Assistant

## 2020-09-23 DIAGNOSIS — Z5321 Procedure and treatment not carried out due to patient leaving prior to being seen by health care provider: Secondary | ICD-10-CM | POA: Diagnosis not present

## 2020-09-23 DIAGNOSIS — R1010 Upper abdominal pain, unspecified: Secondary | ICD-10-CM | POA: Insufficient documentation

## 2020-09-23 DIAGNOSIS — R112 Nausea with vomiting, unspecified: Secondary | ICD-10-CM | POA: Insufficient documentation

## 2020-09-23 DIAGNOSIS — R197 Diarrhea, unspecified: Secondary | ICD-10-CM | POA: Diagnosis not present

## 2020-09-23 LAB — COMPREHENSIVE METABOLIC PANEL
ALT: 21 U/L (ref 0–44)
AST: 20 U/L (ref 15–41)
Albumin: 4 g/dL (ref 3.5–5.0)
Alkaline Phosphatase: 48 U/L (ref 38–126)
Anion gap: 11 (ref 5–15)
BUN: 15 mg/dL (ref 6–20)
CO2: 22 mmol/L (ref 22–32)
Calcium: 8.9 mg/dL (ref 8.9–10.3)
Chloride: 104 mmol/L (ref 98–111)
Creatinine, Ser: 0.8 mg/dL (ref 0.61–1.24)
GFR, Estimated: >60 mL/min (ref >60)
Glucose, Bld: 119 mg/dL — ABNORMAL HIGH (ref 70–99)
Potassium: 3.5 mmol/L (ref 3.5–5.1)
Sodium: 137 mmol/L (ref 135–145)
Total Bilirubin: 0.6 mg/dL (ref 0.3–1.2)
Total Protein: 7 g/dL (ref 6.5–8.1)

## 2020-09-23 LAB — LIPASE, BLOOD: Lipase: 52 U/L — ABNORMAL HIGH (ref 11–51)

## 2020-09-23 LAB — CBC WITH DIFFERENTIAL/PLATELET
Abs Immature Granulocytes: 0.04 10*3/uL (ref 0.00–0.07)
Basophils Absolute: 0.1 10*3/uL (ref 0.0–0.1)
Basophils Relative: 1 %
Eosinophils Absolute: 0.1 10*3/uL (ref 0.0–0.5)
Eosinophils Relative: 1 %
HCT: 48.3 % (ref 39.0–52.0)
Hemoglobin: 16.1 g/dL (ref 13.0–17.0)
Immature Granulocytes: 0 %
Lymphocytes Relative: 16 %
Lymphs Abs: 1.6 10*3/uL (ref 0.7–4.0)
MCH: 31.2 pg (ref 26.0–34.0)
MCHC: 33.3 g/dL (ref 30.0–36.0)
MCV: 93.6 fL (ref 80.0–100.0)
Monocytes Absolute: 0.5 10*3/uL (ref 0.1–1.0)
Monocytes Relative: 6 %
Neutro Abs: 7.4 10*3/uL (ref 1.7–7.7)
Neutrophils Relative %: 76 %
Platelets: 216 10*3/uL (ref 150–400)
RBC: 5.16 MIL/uL (ref 4.22–5.81)
RDW: 13.9 % (ref 11.5–15.5)
WBC: 9.6 10*3/uL (ref 4.0–10.5)
nRBC: 0 % (ref 0.0–0.2)

## 2020-09-23 NOTE — ED Triage Notes (Signed)
Pt complains of nausea, vomiting, and diarrhea x 2 days but it has gotten worse today. Pt reports multiple bowel movements with small amounts of diarrhea. Pt complains of a constant stomachache.

## 2020-09-23 NOTE — ED Provider Notes (Signed)
Emergency Medicine Provider Triage Evaluation Note  [name]. , a 60 y.o. male  was evaluated in triage.  Pt complains of Abdominal pain.  He was seen by his PCP 2 days ago and diagnosed with a viral gastroenteritis.  He states that after that he felt better until today.  He states that he has a achy, cramping pain across his upper abdomen.  He denies any cough, chest pain, or shortness of breath.  No fevers.  He reports he has vomited twice and had innumerable small bouts of diarrhea.  No blood in his vomit or diarrhea..  Review of Systems  Positive: Nausea, vomiting, diarrhea, abdominal pain Negative: Fevers  Physical Exam  There were no vitals taken for this visit. Gen:   Awake, no distress   Resp:  Normal effort  MSK:   Moves extremities without difficulty  Other:  Abdomen is soft, nontender nondistended.  Medical Decision Making  Medically screening exam initiated at 7:28 PM.  Appropriate orders placed.  [name]. was informed that the remainder of the evaluation will be completed by another provider, this initial triage assessment does not replace that evaluation, and the importance of remaining in the ED until their evaluation is complete.  Note: Portions of this report may have been transcribed using voice recognition software. Every effort was made to ensure accuracy; however, inadvertent computerized transcription errors may be present    Ollen Gross 09/23/20 1932    Dorie Rank, MD 09/24/20 0020

## 2020-09-26 ENCOUNTER — Other Ambulatory Visit: Payer: Self-pay

## 2020-09-26 ENCOUNTER — Ambulatory Visit (INDEPENDENT_AMBULATORY_CARE_PROVIDER_SITE_OTHER)
Admission: RE | Admit: 2020-09-26 | Discharge: 2020-09-26 | Disposition: A | Discharge status: Home / Self Care | Payer: 59 | Source: Ambulatory Visit | Attending: Family Medicine | Admitting: Family Medicine

## 2020-09-26 ENCOUNTER — Ambulatory Visit (INDEPENDENT_AMBULATORY_CARE_PROVIDER_SITE_OTHER): Payer: 59 | Admitting: Family Medicine

## 2020-09-26 VITALS — BP 120/70 | HR 67 | Temp 98.9°F | Wt 205.1 lb

## 2020-09-26 DIAGNOSIS — R1011 Right upper quadrant pain: Secondary | ICD-10-CM

## 2020-09-26 DIAGNOSIS — R1012 Left upper quadrant pain: Secondary | ICD-10-CM

## 2020-09-26 MED ORDER — HYDROCODONE-ACETAMINOPHEN 5-325 MG PO TABS: 1.0000 | ORAL_TABLET | Freq: Four times a day (QID) | ORAL | 0 refills | Status: DC | PRN

## 2020-09-26 MED ORDER — IOHEXOL 300 MG/ML  SOLN
100.0000 mL | Freq: Once | INTRAMUSCULAR | Status: AC | PRN
  Administered 2020-09-26: 100 mL via INTRAVENOUS

## 2020-09-26 NOTE — Progress Notes (Signed)
Established Patient Office Visit  Subjective:  Patient ID: [name]., male    DOB: [birth year]  Age: 60 y.o. MRN: [id number]  CC:  Chief Complaint  Patient presents with   Follow-up    Not much improvement. Able to eat small amounts occasionally     HPI [name]. presents for intermittent upper abdominal pain somewhat bilateral with intermittent fevers along with some intermittent vomiting and diarrhea symptoms.  He was seen here on the second and symptoms started 6 days ago.  At the time of his visit his symptoms seem to be somewhat improved and we had prescribed some Zofran.  We suspected probable gastroenteritis.  At that point he did not have any red flags such as bloody stools or significant fever.  He went to the ER on the fourth with similar complaints.  He had actually felt somewhat better after the second until the visit on the fourth.  He had crampy achy pain across his upper abdomen somewhat bilaterally but mostly epigastric.  No hematemesis and still no bloody stools.  He states he has had temperatures up to 100.3 intermittently.  He ended up getting CBC, CMP, and lipase.  Lipase was only one-point elevated above normal at 52.  CBC unremarkable.  Chemistries no significant abnormality.  Patient waited for 5 hours and bleeding.  His wife who has Alzheimer's disease had dropped him off and apparently ran off the road on her way back home and that prompted his early departure from the ER.  Patient states that his pain is moderate with episodes.  Denies any right upper quadrant localized pain.  No radiation to the back. Has had prior colonoscopy with diverticulosis noted left colon but not transverse colon.  Has kept down some yogurt and was able to eat some chicken strips yesterday and kept those down.  Still had some fever up to 100.3 last night.  No vomiting since yesterday.  Still has some intermittent diarrhea.  Past Medical History:  Diagnosis Date   COPD, mild ([place])     History of anal fissures    History of colon polyps    Right hydrocele    Seasonal allergies     Past Surgical History:  Procedure Laterality Date   COLONOSCOPY  last one 09-17-2016   EVALUATION UNDER ANESTHESIA WITH ANAL FISTULECTOMY N/A 12/09/2012   Procedure: EXAM UNDER ANESTHESIA, FISTULOTOMY;  Surgeon: Leighton Ruff, MD;  Location: [facility];  Service: General;  Laterality: N/A;   EXAMINATION UNDER ANESTHESIA N/A 08/03/2012   (anal fistula) placement of seton drain; Dr Autumn Messing    EXCISION [facility] BODY AND GRANULOMA RIGHT INDEX FINGER  07-18-2005   HYDROCELE EXCISION Right 01/30/2017   Procedure: HYDROCELECTOMY ADULT;  Surgeon: Franchot Gallo, MD;  Location: [facility];  Service: Urology;  Laterality: Right;   ORIF LEFT ANKLE FX  1981   HARDWARE REMOVED     Family History  Problem Relation Age of Onset   Heart disease Mother        diastolic heart failure   Pulmonary fibrosis Father    Cancer Sister        breast cancer   Diabetes Other    Mental illness Other    Cancer Other        breast, prostate   Colon cancer Neg Hx    Esophageal cancer Neg Hx    Rectal cancer Neg Hx    Stomach cancer Neg Hx     Social  History   Socioeconomic History   Marital status: Married    Spouse name: Not on file   Number of children: Not on file   Years of education: Not on file   Highest education level: Not on file  Occupational History   Not on file  Tobacco Use   Smoking status: Former    Packs/day: 1.00    Years: 35.00    Pack years: 35.00    Types: Cigarettes    Quit date: 12/18/2009    Years since quitting: 10.7   Smokeless tobacco: Never  Vaping Use   Vaping Use: Never used  Substance and Sexual Activity   Alcohol use: Yes    Alcohol/week: 6.0 standard drinks    Types: 6 Cans of beer per week    Comment: occasional   Drug use: No   Sexual activity: Not on file  Other Topics Concern   Not on file  Social History Narrative    Not on file   Social Determinants of Health   Financial Resource Strain: Not on file  Food Insecurity: Not on file  Transportation Needs: Not on file  Physical Activity: Not on file  Stress: Not on file  Social Connections: Not on file  Intimate Partner Violence: Not on file    Outpatient Medications Prior to Visit  Medication Sig Dispense Refill   ondansetron (ZOFRAN ODT) 8 MG disintegrating tablet Take 1 tablet (8 mg total) by mouth every 8 (eight) hours as needed for nausea or vomiting. 10 tablet 0   zolpidem (AMBIEN) 10 MG tablet TAKE 1 TABLET BY MOUTH ONCE DAILY AT BEDTIME AS NEEDED FOR SLEEP 30 tablet 1   No facility-administered medications prior to visit.    Allergies  Allergen Reactions   Adhesive [Tape] Other (See Comments)    Skin blister red   Latex Other (See Comments)    "skin irritated"   Other Other (See Comments)    Skin blister red   Penicillins Rash    ROS Review of Systems  Constitutional:  Positive for appetite change, chills and fever.  Respiratory:  Negative for cough and shortness of breath.   Cardiovascular:  Negative for chest pain.  Gastrointestinal:  Positive for abdominal pain, diarrhea, nausea and vomiting. Negative for abdominal distention and blood in stool.  Genitourinary:  Negative for dysuria.  Neurological:  Negative for dizziness.     Objective:    Physical Exam Vitals reviewed.  Constitutional:      General: He is not in acute distress.    Appearance: Normal appearance. He is not ill-appearing or toxic-appearing.  Cardiovascular:     Rate and Rhythm: Normal rate and regular rhythm.  Pulmonary:     Effort: Pulmonary effort is normal.     Breath sounds: Normal breath sounds.  Abdominal:     General: Bowel sounds are normal. There is no distension.     Palpations: Abdomen is soft.     Tenderness: There is no guarding or rebound.     Comments: He has some mild tenderness to palpation upper abdomen diffusely.  Not  localizing to the right upper quadrant.  No guarding.  Neurological:     Mental Status: He is alert.    BP 120/70 (BP Location: Left Arm, Patient Position: Sitting, Cuff Size: Normal)   Pulse 67   Temp 98.9 F (37.2 C) (Oral)   Wt 205 lb 1.6 oz (93 kg)   SpO2 98%   BMI 27.06 kg/m  Wt Readings from Last  3 Encounters:  09/26/20 205 lb 1.6 oz (93 kg)  09/21/20 215 lb (97.5 kg)  08/01/20 215 lb 11.2 oz (97.8 kg)     Health Maintenance Due  Topic Date Due   Zoster Vaccines- Shingrix (1 of 2) Never done   COVID-19 Vaccine (2 - Pfizer series) 06/22/2020   INFLUENZA VACCINE  08/20/2020    There are no preventive care reminders to display for this patient.  Lab Results  Component Value Date   TSH 1.33 07/02/2020   Lab Results  Component Value Date   WBC 9.6 09/23/2020   HGB 16.1 09/23/2020   HCT 48.3 09/23/2020   MCV 93.6 09/23/2020   PLT 216 09/23/2020   Lab Results  Component Value Date   NA 137 09/23/2020   K 3.5 09/23/2020   CO2 22 09/23/2020   GLUCOSE 119 (H) 09/23/2020   BUN 15 09/23/2020   CREATININE 0.80 09/23/2020   BILITOT 0.6 09/23/2020   ALKPHOS 48 09/23/2020   AST 20 09/23/2020   ALT 21 09/23/2020   PROT 7.0 09/23/2020   ALBUMIN 4.0 09/23/2020   CALCIUM 8.9 09/23/2020   ANIONGAP 11 09/23/2020   GFR 88.20 07/02/2020   Lab Results  Component Value Date   CHOL 189 07/02/2020   Lab Results  Component Value Date   HDL 57.30 07/02/2020   Lab Results  Component Value Date   LDLCALC 119 (H) 07/02/2020   Lab Results  Component Value Date   TRIG 63.0 07/02/2020   Lab Results  Component Value Date   CHOLHDL 3 07/02/2020   Lab Results  Component Value Date   HGBA1C 6.0 07/02/2020      Assessment & Plan:   Problem List Items Addressed This Visit   None Visit Diagnoses     Abdominal pain, bilateral upper quadrant    -  Primary   Relevant Orders   CT ABDOMEN PELVIS W WO CONTRAST     Patient presents with almost 1 week history of  upper abdominal pain somewhat poorly localized along with intermittent vomiting and diarrhea and reported intermittent fever.  He had recent labs few days ago in the ER which were unremarkable as above.  Minimally elevated lipase of 52 and doubt clinically significant.  Doubt pancreatitis.  Doubt diverticulitis.  Previous colonoscopy did not show any transverse colon diverticulosis.  Patient nontoxic in appearance at this time and abdomen non-acute.  Given intermittent fever for 6 days, intermittent vomiting and diarrhea go ahead with CT abdomen and pelvis to further assess.  Fever would seem unlikely for viral gastroenteritis to go this long a duration.  Meds ordered this encounter  Medications   HYDROcodone-acetaminophen (NORCO/VICODIN) 5-325 MG tablet    Sig: Take 1 tablet by mouth every 6 (six) hours as needed for moderate pain.    Dispense:  15 tablet    Refill:  0    Follow-up: No follow-ups on file.    Carolann Littler, MD

## 2020-11-06 ENCOUNTER — Other Ambulatory Visit: Payer: Self-pay | Admitting: Family Medicine

## 2020-11-07 NOTE — Telephone Encounter (Signed)
Last filled 08/09/2020 Last OV 09/26/2020  Ok to fill?

## 2021-03-04 ENCOUNTER — Other Ambulatory Visit: Payer: Self-pay | Admitting: Family Medicine

## 2021-03-05 NOTE — Telephone Encounter (Signed)
Last refill- 11/07/2020- 30 tabs, 1 refill Last OV- 09/26/2020  No future OV scheduled.

## 2021-06-16 ENCOUNTER — Other Ambulatory Visit: Payer: Self-pay | Admitting: Family Medicine

## 2021-07-25 ENCOUNTER — Other Ambulatory Visit: Payer: Self-pay | Admitting: Acute Care

## 2021-07-25 ENCOUNTER — Ambulatory Visit (HOSPITAL_BASED_OUTPATIENT_CLINIC_OR_DEPARTMENT_OTHER)
Admission: RE | Admit: 2021-07-25 | Discharge: 2021-07-25 | Disposition: A | Discharge status: Home / Self Care | Payer: 59 | Source: Ambulatory Visit | Attending: Acute Care | Admitting: Acute Care

## 2021-07-25 DIAGNOSIS — Z122 Encounter for screening for malignant neoplasm of respiratory organs: Secondary | ICD-10-CM

## 2021-07-25 DIAGNOSIS — Z87891 Personal history of nicotine dependence: Secondary | ICD-10-CM

## 2021-07-25 DIAGNOSIS — I700 Atherosclerosis of aorta: Secondary | ICD-10-CM | POA: Insufficient documentation

## 2021-07-25 DIAGNOSIS — J439 Emphysema, unspecified: Secondary | ICD-10-CM | POA: Insufficient documentation

## 2021-09-30 ENCOUNTER — Other Ambulatory Visit: Payer: Self-pay | Admitting: Family Medicine

## 2021-09-30 ENCOUNTER — Telehealth: Payer: Self-pay | Admitting: Family Medicine

## 2021-09-30 MED ORDER — ZOLPIDEM TARTRATE 10 MG PO TABS: ORAL_TABLET | ORAL | 0 refills | Status: DC

## 2021-09-30 NOTE — Telephone Encounter (Signed)
Pt has not been seen in over 1 yrs pt does not have insurance and does not want to make an appt. Pt would like a refill on zolpidem (AMBIEN) 10 MG tablet [id number] [place] [place] [address]  [phone] Fax:  [phone]

## 2021-09-30 NOTE — Telephone Encounter (Signed)
Refilled once. 

## 2021-10-01 NOTE — Telephone Encounter (Signed)
Noted  

## 2021-10-08 ENCOUNTER — Encounter: Payer: Self-pay | Admitting: Gastroenterology

## 2021-11-01 ENCOUNTER — Other Ambulatory Visit: Payer: Self-pay

## 2021-11-26 ENCOUNTER — Other Ambulatory Visit: Payer: Self-pay | Admitting: Family Medicine

## 2021-11-27 ENCOUNTER — Telehealth: Payer: Self-pay | Admitting: Family Medicine

## 2021-11-27 NOTE — Telephone Encounter (Signed)
Pt called to inform MD that he lost his insurance, but is wondering if MD could still send a refill of the  zolpidem (AMBIEN) 10 MG tablet   Pt was offered an OV, but Pt reported he cannot afford an office visit, since he lost his insurance.  LOV:  09/26/20  [id number] [place] [place] [address]: [phone] Fax: [phone]    Please advise.

## 2021-11-28 MED ORDER — ZOLPIDEM TARTRATE 10 MG PO TABS: ORAL_TABLET | ORAL | 0 refills | Status: DC

## 2021-11-28 NOTE — Telephone Encounter (Signed)
Left a message for the pt to return my call.  

## 2021-11-28 NOTE — Telephone Encounter (Signed)
Patient informed that rx was sent 

## 2021-11-28 NOTE — Telephone Encounter (Signed)
Refilled, but we are unable to refill indefinitely without seeing once yearly secondary to [facility] Board recommendations.

## 2022-06-20 ENCOUNTER — Telehealth: Payer: Self-pay | Admitting: Acute Care

## 2022-06-20 ENCOUNTER — Other Ambulatory Visit: Payer: Self-pay | Admitting: Acute Care

## 2022-06-20 DIAGNOSIS — Z87891 Personal history of nicotine dependence: Secondary | ICD-10-CM

## 2022-06-20 DIAGNOSIS — Z122 Encounter for screening for malignant neoplasm of respiratory organs: Secondary | ICD-10-CM

## 2022-07-03 ENCOUNTER — Telehealth: Payer: Self-pay | Admitting: Acute Care

## 2022-07-03 NOTE — Telephone Encounter (Signed)
PT ret a call from we think Lung Cancer screen.

## 2022-07-04 NOTE — Telephone Encounter (Signed)
Attempted to contact pt. Had to leave VM for pt to call back.

## 2022-07-17 NOTE — Telephone Encounter (Signed)
Please try one more time so I can close encounter. TY.

## 2022-07-18 NOTE — Telephone Encounter (Signed)
Angelique Blonder called the patient and gave him the info

## 2022-07-18 NOTE — Telephone Encounter (Signed)
Called and spoke with patient and let him know that the cost for the lung screening CT is $314 and there may be a separate radiology reading fee that is no more than $100. Pt is ok with proceeding with CT.

## 2022-07-28 ENCOUNTER — Ambulatory Visit (HOSPITAL_BASED_OUTPATIENT_CLINIC_OR_DEPARTMENT_OTHER)
Admission: RE | Admit: 2022-07-28 | Discharge: 2022-07-28 | Disposition: A | Discharge status: Home / Self Care | Payer: 59 | Source: Ambulatory Visit | Attending: Body Imaging | Admitting: Body Imaging

## 2022-07-28 DIAGNOSIS — Z122 Encounter for screening for malignant neoplasm of respiratory organs: Secondary | ICD-10-CM | POA: Insufficient documentation

## 2022-07-28 DIAGNOSIS — Z87891 Personal history of nicotine dependence: Secondary | ICD-10-CM | POA: Insufficient documentation

## 2022-07-31 ENCOUNTER — Other Ambulatory Visit: Payer: Self-pay

## 2022-07-31 DIAGNOSIS — Z87891 Personal history of nicotine dependence: Secondary | ICD-10-CM

## 2022-07-31 DIAGNOSIS — Z122 Encounter for screening for malignant neoplasm of respiratory organs: Secondary | ICD-10-CM

## 2022-08-19 ENCOUNTER — Telehealth: Payer: Self-pay | Admitting: Acute Care

## 2022-08-19 NOTE — Telephone Encounter (Signed)
Patient would like Angelique Blonder to give him a call because he has a question regarding billing.  Also gave patient the billing dept. Number but he also wanted to speak w/Denise.

## 2022-08-19 NOTE — Telephone Encounter (Signed)
Spoke with pt and he stated that he has already spoken with the billing department and he has no further questions at this time.

## 2022-11-23 IMAGING — CT CT CARDIAC CORONARY ARTERY CALCIUM SCORE
3 series · 14 of 20 positions shown, 16 images · non-contrast
Comparison: Chest CT 07/25/2020.
COMPARISON: Chest CT 07/25/2020.

Addendum:
EXAM:
OVER-READ INTERPRETATION  CT CHEST

The following report is an over-read performed by radiologist Dr.
Jeremiah Larue [REDACTED] on 09/11/2020. This
over-read does not include interpretation of cardiac or coronary
anatomy or pathology. The coronary calcium score interpretation by
the cardiologist is attached.
CLINICAL DATA: Cardiovascular Disease Risk stratification
Coronary Calcium Score
TECHNIQUE: A gated, non-contrast computed tomography scan of the heart was
performed using 3mm slice thickness. Axial images were analyzed on a
dedicated workstation. Calcium scoring of the coronary arteries was
performed using the Agatston method.

[Series 2: cascseq 2.0 sa36 70% (id) · axial · 0.45mm/px · z∈[-255,-165]mm · 4 of 76 slices shown]
[im 16/76  vessel]
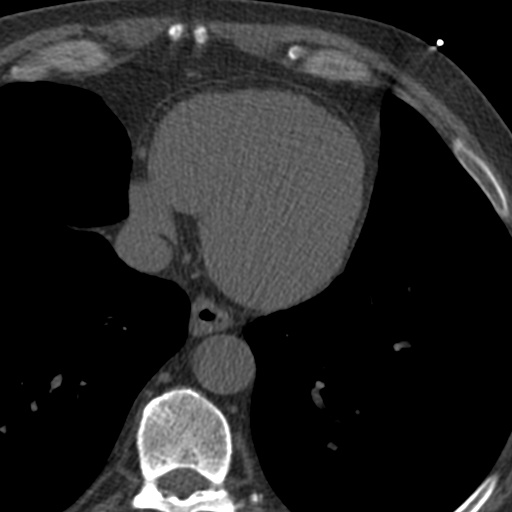
[im 31/76  vessel]
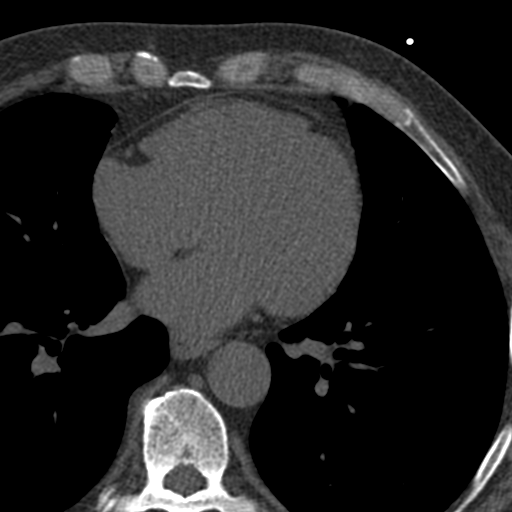
[im 46/76  vessel]
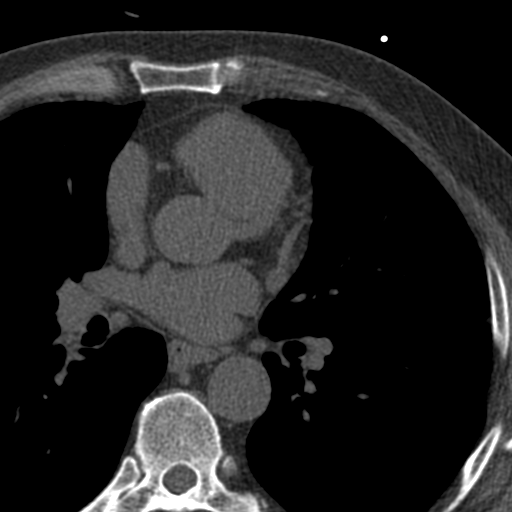
[im 61/76  vessel]
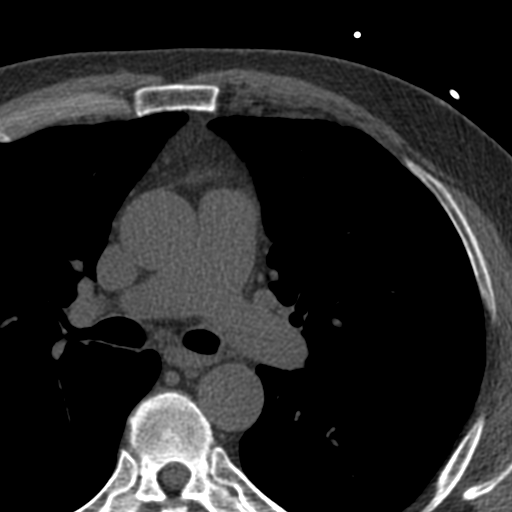

[Series 3: cascseq 2.0 bf37 st · axial · 0.77mm/px · z∈[-261,-161]mm · 5 of 76 slices shown, 7 images]
[im 13/76  vessel]
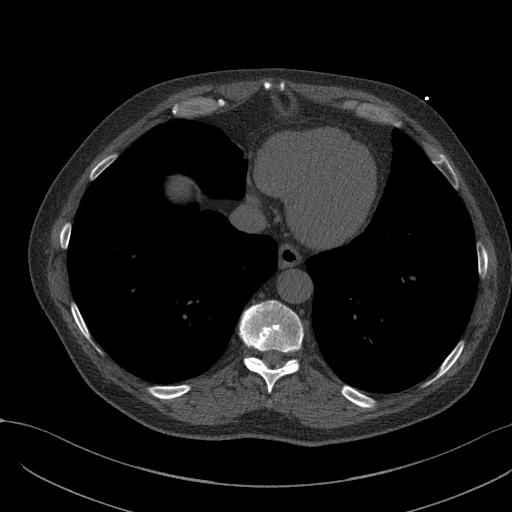
[im 13/76  lung]
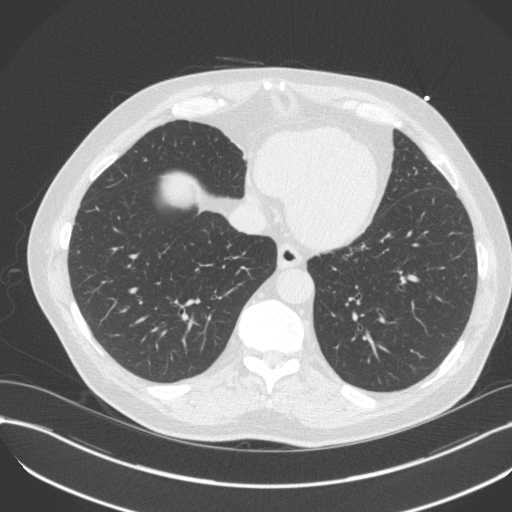
[im 26/76  vessel]
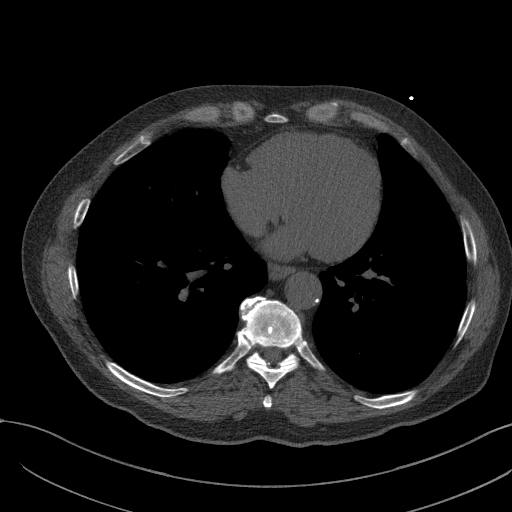
[im 38/76  vessel]
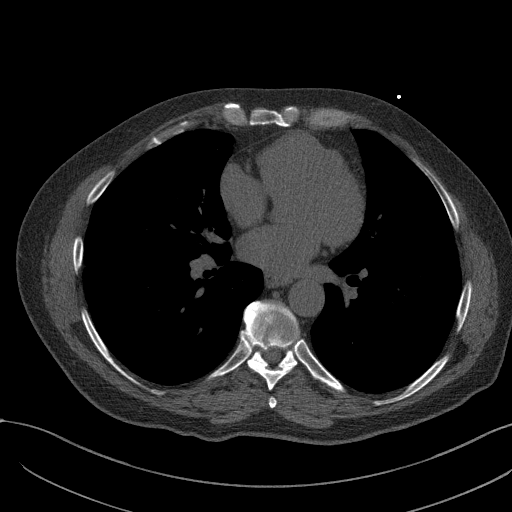
[im 51/76  vessel]
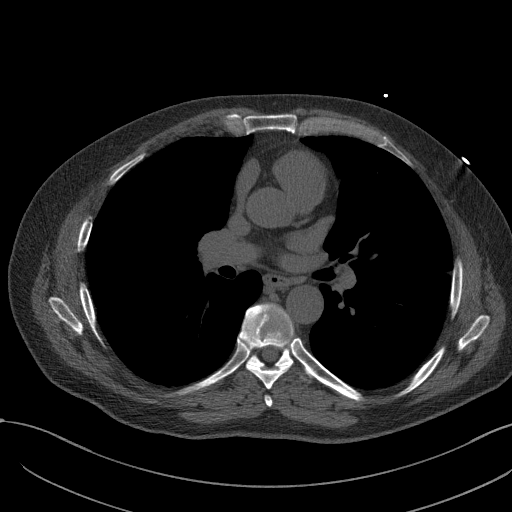
[im 63/76  vessel]
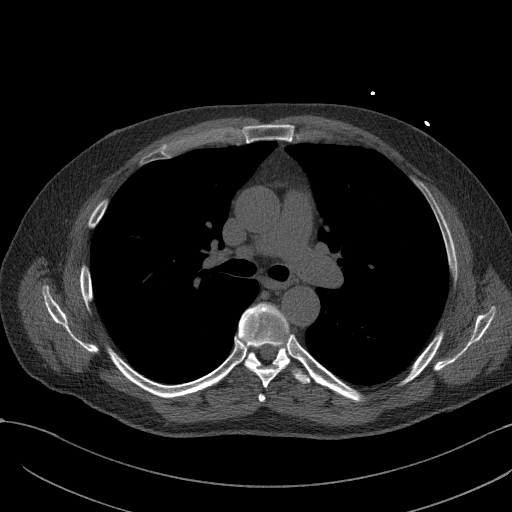
[im 63/76  lung]
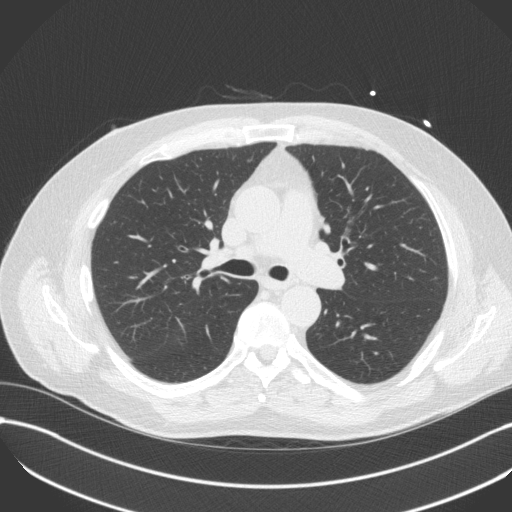

[Series 4: cascseq 2.0 br59 lung · axial · 0.77mm/px · z∈[-261,-161]mm · 5 of 76 slices shown]
[im 13/76  lung]
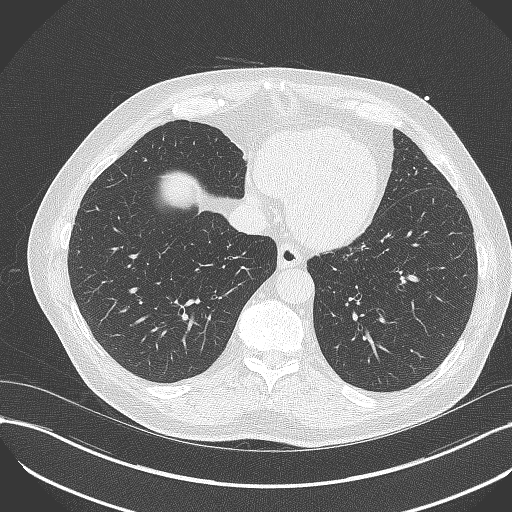
[im 26/76  lung]
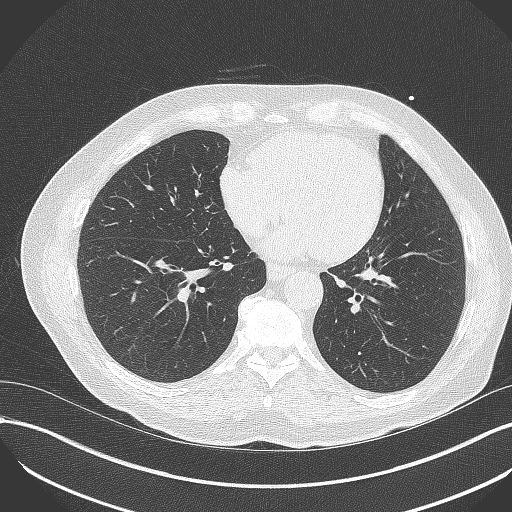
[im 38/76  lung]
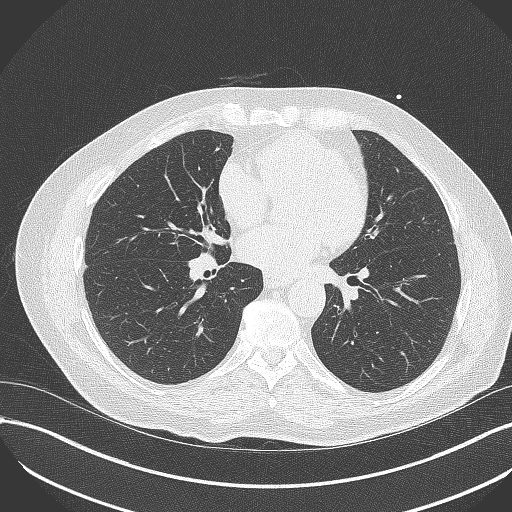
[im 51/76  lung]
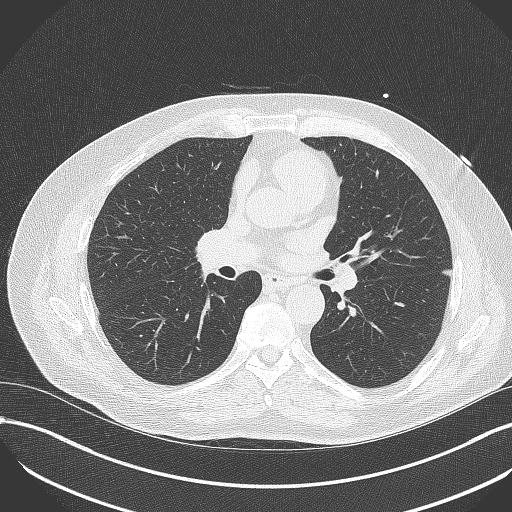
[im 63/76  lung]
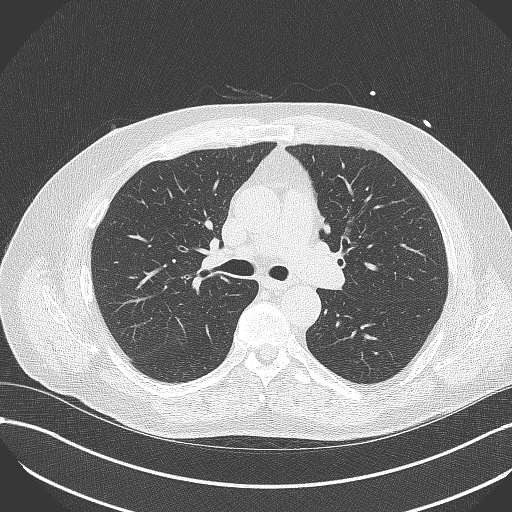

[14 of 20 positions shown; findings below may reference images not displayed]

FINDINGS: Atherosclerotic calcifications in the thoracic aorta. Mild diffuse
bronchial wall thickening with mild centrilobular and paraseptal
emphysema. Within the visualized portions of the thorax there are no
suspicious appearing pulmonary nodules or masses, there is no acute
consolidative airspace disease, no pleural effusions, no
pneumothorax and no lymphadenopathy. Visualized portions of the
upper abdomen are unremarkable. There are no aggressive appearing
lytic or blastic lesions noted in the visualized portions of the
skeleton.
IMPRESSION: 1. Mild diffuse bronchial wall thickening with mild centrilobular
and paraseptal emphysema Emphysema (67RXD-U5A.8); imaging findings
suggestive of underlying COPD.
2.  Aortic Atherosclerosis (67RXD-ERM.M).
FINDINGS: Coronary Calcium Score:

Left main: 0

Left anterior descending artery: 0

Left circumflex artery: 0

Right coronary artery: 0

Total: 0

Percentile: 0

Pericardium: Normal.

Ascending Aorta: Normal caliber.

Non-cardiac: See separate report from [REDACTED].
IMPRESSION: Coronary calcium score of 0. This was 0 percentile for age-, race-,
and sex-matched controls.

Minimal aortic atherosclerosis.



If CAC=0, it is reasonable to withhold statin therapy and reassess
in 5 to 10 years, as long as higher risk conditions are absent
(diabetes mellitus, family history of premature CHD in first degree
relatives (males <55 years; females <65 years), cigarette smoking,
or LDL >=190 mg/dL).

If CAC is 1 to 99, it is reasonable to initiate statin therapy for
patients >=55 years of age.

If CAC is >=100 or >=75th percentile, it is reasonable to initiate
statin therapy at any age.

Cardiology referral should be considered for patients with CAC
scores >=400 or >=75th percentile.

*2718 AHA/ACC/AACVPR/AAPA/ABC/STAM/TIGER/QUIRIJN/Bumkyu/VYBEZ/LOO/PAHL
Guideline on the Management of Blood Cholesterol: A Report of the
American College of Cardiology/American Heart Association Task Force
on Clinical Practice Guidelines. J Am Coll Cardiol.
4312;73(24):7672-7420.

*** End of Addendum ***
EXAM:
OVER-READ INTERPRETATION  CT CHEST

The following report is an over-read performed by radiologist Dr.
Jeremiah Larue [REDACTED] on 09/11/2020. This
over-read does not include interpretation of cardiac or coronary
anatomy or pathology. The coronary calcium score interpretation by
the cardiologist is attached.
FINDINGS: Atherosclerotic calcifications in the thoracic aorta. Mild diffuse
bronchial wall thickening with mild centrilobular and paraseptal
emphysema. Within the visualized portions of the thorax there are no
suspicious appearing pulmonary nodules or masses, there is no acute
consolidative airspace disease, no pleural effusions, no
pneumothorax and no lymphadenopathy. Visualized portions of the
upper abdomen are unremarkable. There are no aggressive appearing
lytic or blastic lesions noted in the visualized portions of the
skeleton.
IMPRESSION: 1. Mild diffuse bronchial wall thickening with mild centrilobular
and paraseptal emphysema Emphysema (67RXD-U5A.8); imaging findings
suggestive of underlying COPD.
2.  Aortic Atherosclerosis (67RXD-ERM.M).

## 2022-12-08 IMAGING — CT CT ABD-PELV W/ CM
2 of 5 series · 16 of 46 positions shown, 18 images · IV contrast (omnipaque)
Comparison: Chest CT 07/25/2020.

CLINICAL DATA: Mid to upper abdominal pain with nausea, vomiting
and diarrhea for 5 days. No previous relevant surgery. Question
diverticulitis.

EXAM:
CT ABDOMEN AND PELVIS WITH CONTRAST
TECHNIQUE: Multidetector CT imaging of the abdomen and pelvis was performed
using the standard protocol following bolus administration of
intravenous contrast.
CONTRAST:  100mL OMNIPAQUE IOHEXOL 300 MG/ML  SOLN

[Series 2: abd/pel w · axial · 0.77mm/px · z∈[-509,-104]mm · 13 of 91 slices shown, 15 images]
[im 5/91  soft-tissue]
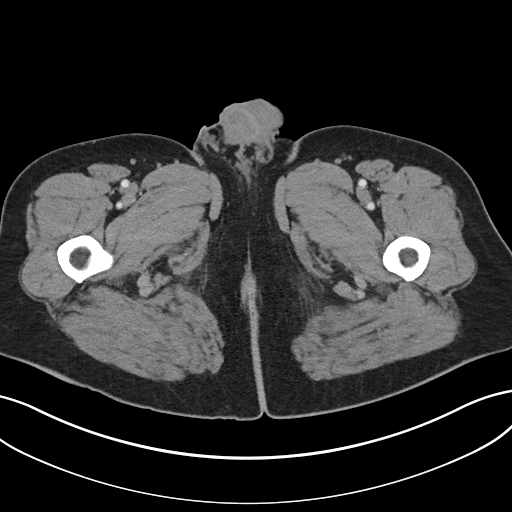
[im 5/91  bone]
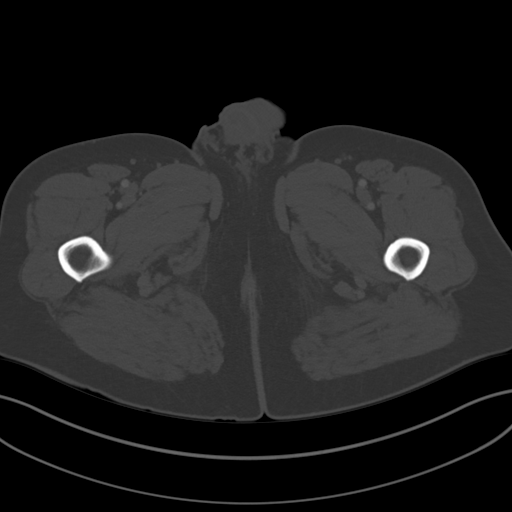
[im 15/91  soft-tissue]
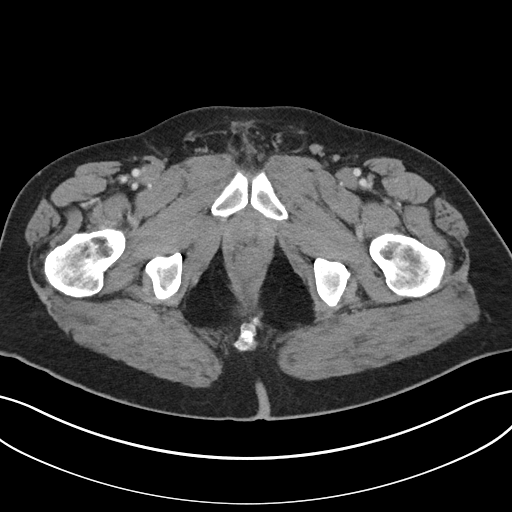
[im 19/91  soft-tissue]
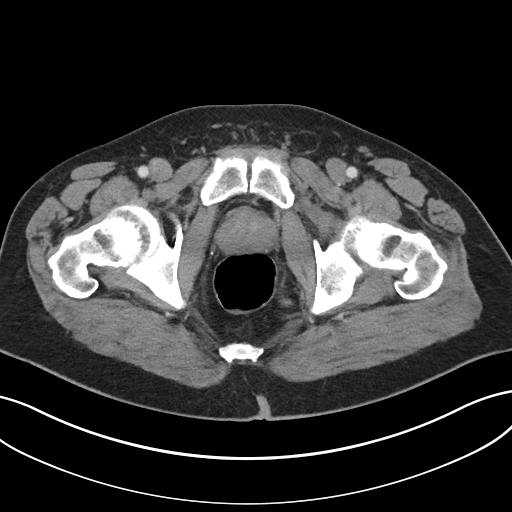
[im 24/91  soft-tissue]
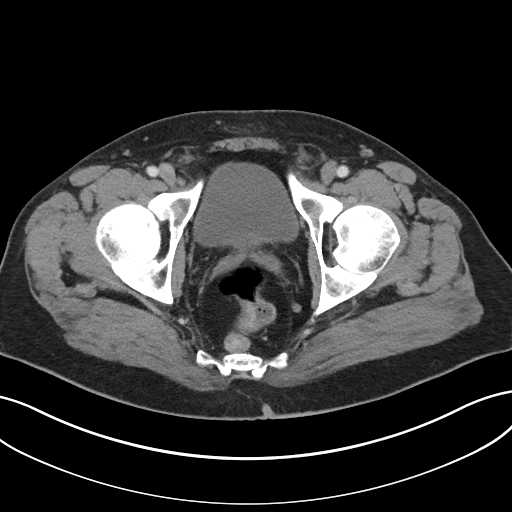
[im 34/91  soft-tissue]
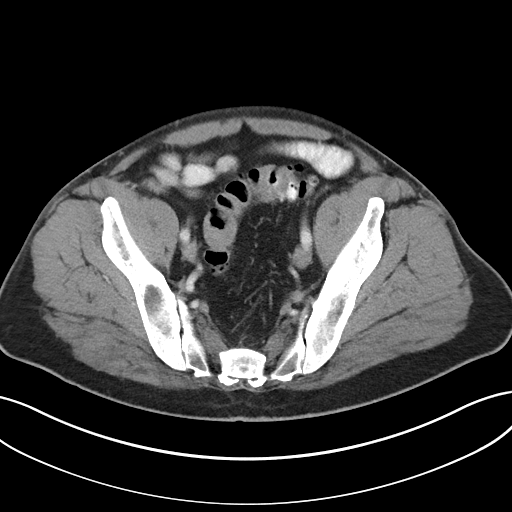
[im 38/91  soft-tissue]
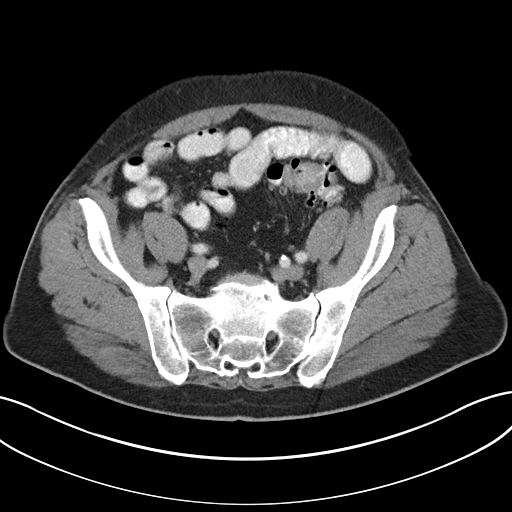
[im 48/91  soft-tissue]
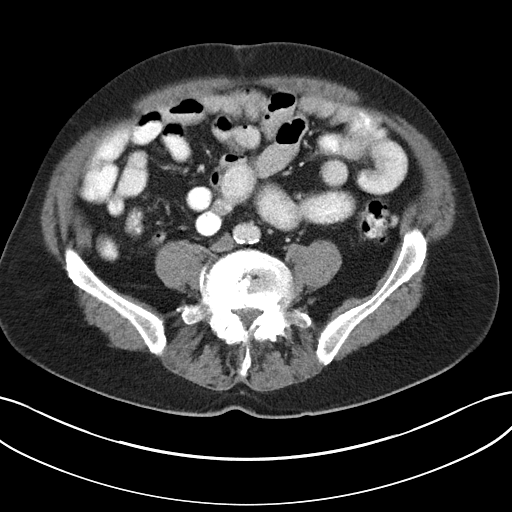
[im 53/91  soft-tissue]
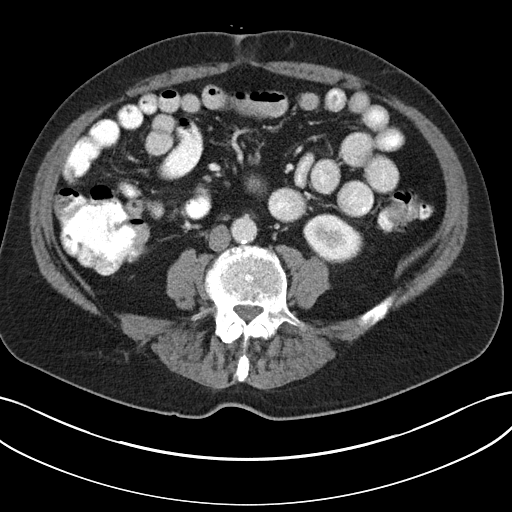
[im 57/91  soft-tissue]
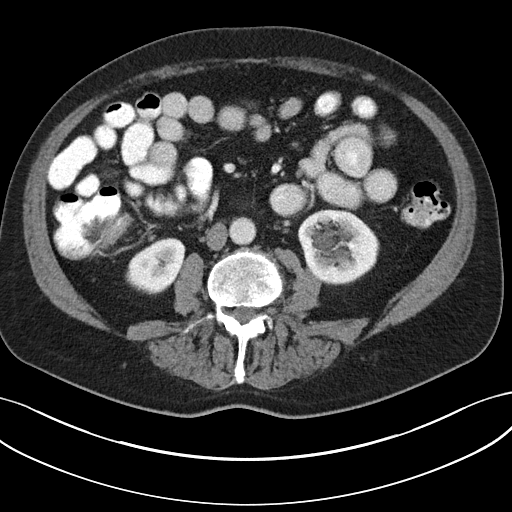
[im 57/91  bone]
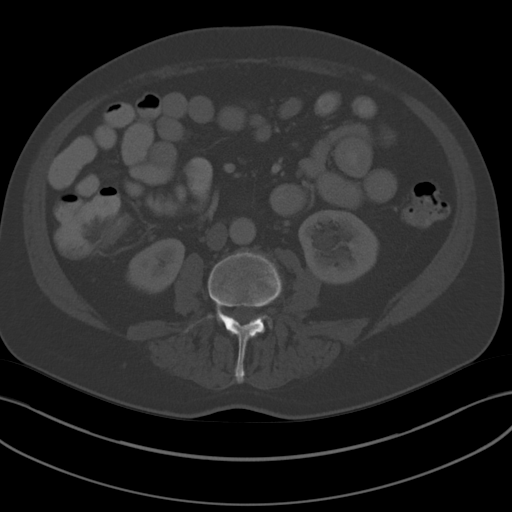
[im 67/91  soft-tissue]
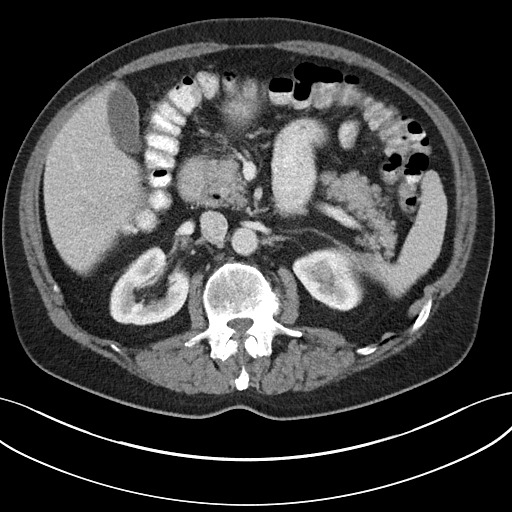
[im 72/91  soft-tissue]
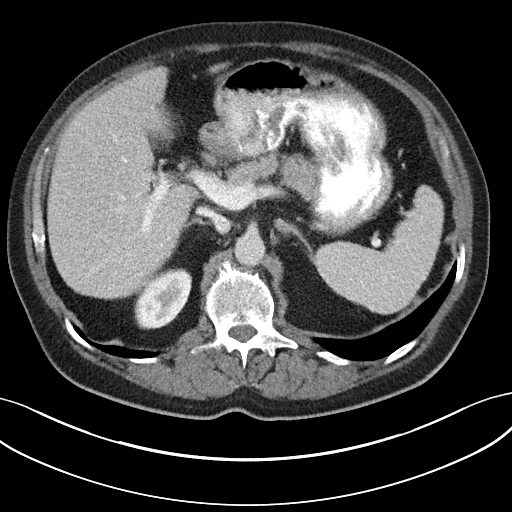
[im 76/91  soft-tissue]
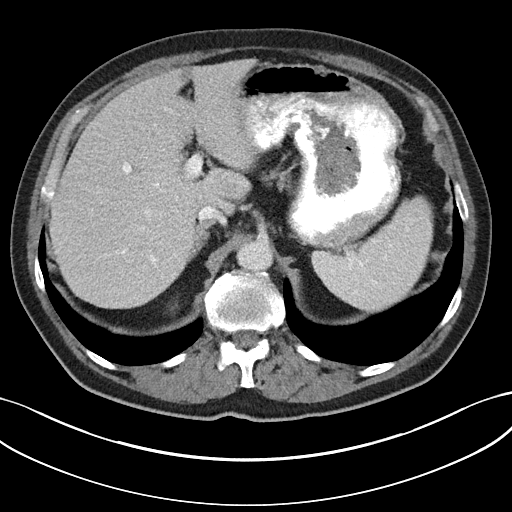
[im 86/91  soft-tissue]
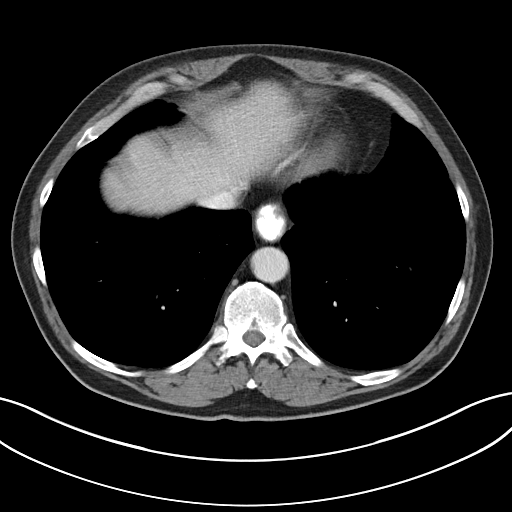

[Series 5: coronal st · coronal · 0.75mm/px · 3 of 92 slices shown]
[im 31/92  soft-tissue]
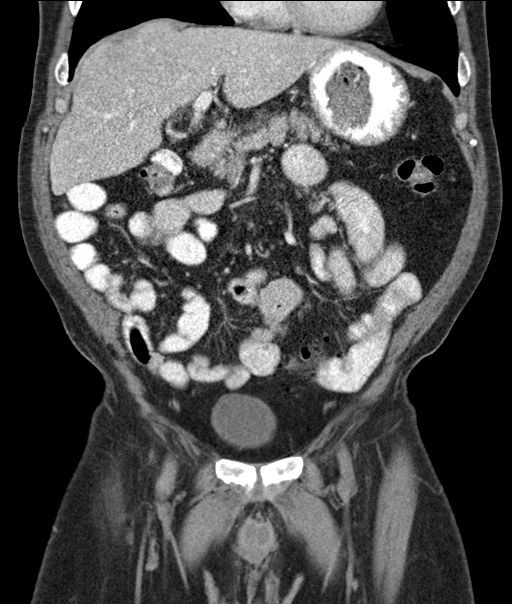
[im 41/92  soft-tissue]
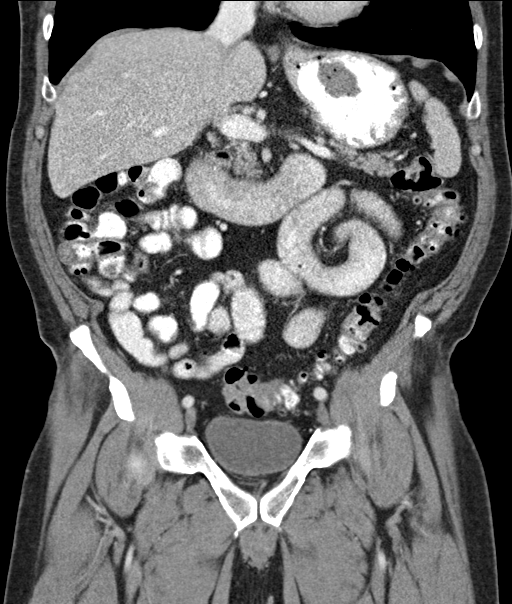
[im 51/92  soft-tissue]
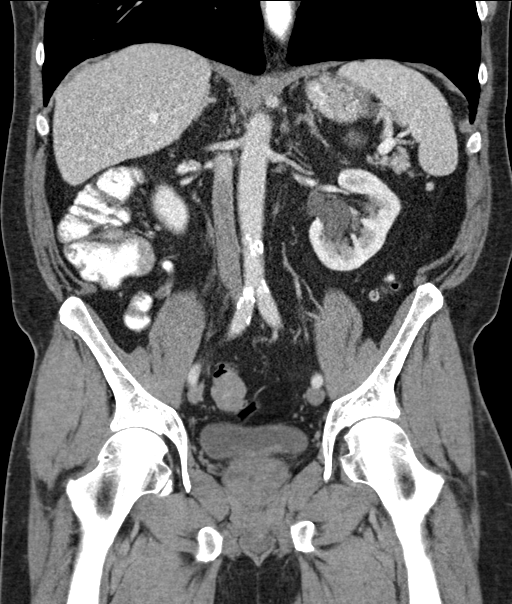

[16 of 46 positions shown; findings below may reference images not displayed]

FINDINGS: Lower chest: Clear lung bases. No significant pleural or pericardial
effusion. There is a small hiatal hernia.

Hepatobiliary: The liver is normal in density without suspicious
focal abnormality. No evidence of gallstones, gallbladder wall
thickening or biliary dilatation.

Pancreas: Unremarkable. No pancreatic ductal dilatation or
surrounding inflammatory changes.

Spleen: Normal in size without focal abnormality.

Adrenals/Urinary Tract: Both adrenal glands appear normal. There are
small renal sinus cysts bilaterally. No evidence of urinary tract
calculus or hydronephrosis. The bladder appears unremarkable.

Stomach/Bowel: Enteric contrast was administered and has passed into
the sigmoid colon. There is ingested material in the stomach and a
small proximal duodenal diverticulum. The stomach, small bowel,
appendix and proximal colon otherwise appear unremarkable. There are
diverticular changes throughout the descending and sigmoid colon.
There is diffuse sigmoid colon wall thickening without significant
surrounding inflammatory changes. No evidence of bowel obstruction
or perforation.

Vascular/Lymphatic: There are no enlarged abdominal or pelvic lymph
nodes. Aortic and branch vessel atherosclerosis without acute
vascular findings. The portal, superior mesenteric and splenic veins
are patent.

Reproductive: The prostate gland and seminal vesicles appear
unremarkable.

Other: No ascites or free air. Small umbilical hernia containing
only fat.

Musculoskeletal: No acute or significant osseous findings.
Degenerative changes in the lumbar spine associated with a mild
convex left scoliosis.
IMPRESSION: 1. Prominent distal colonic diverticulosis with sigmoid colon wall
thickening, but no surrounding inflammation to suggest active
diverticulitis. No evidence of bowel obstruction or perforation. The
appendix appears normal.
2. Small hiatal hernia.
3. Renal sinus cysts bilaterally.
4.  Aortic Atherosclerosis ([id number]-44I.I).

## 2023-03-10 ENCOUNTER — Telehealth: Payer: Self-pay | Admitting: Family Medicine

## 2023-03-10 NOTE — Telephone Encounter (Signed)
Called and left vm to r/s due to weather.

## 2023-03-11 ENCOUNTER — Encounter: Payer: Self-pay | Admitting: Family Medicine

## 2023-03-17 ENCOUNTER — Encounter: Payer: Self-pay | Admitting: Family Medicine

## 2023-03-17 ENCOUNTER — Ambulatory Visit (INDEPENDENT_AMBULATORY_CARE_PROVIDER_SITE_OTHER): Payer: 59 | Admitting: Family Medicine

## 2023-03-17 VITALS — BP 102/66 | HR 73 | Temp 98.1°F | Ht 74.0 in | Wt 228.6 lb

## 2023-03-17 DIAGNOSIS — Z0000 Encounter for general adult medical examination without abnormal findings: Secondary | ICD-10-CM | POA: Diagnosis not present

## 2023-03-17 DIAGNOSIS — M25562 Pain in left knee: Secondary | ICD-10-CM

## 2023-03-17 DIAGNOSIS — R739 Hyperglycemia, unspecified: Secondary | ICD-10-CM | POA: Diagnosis not present

## 2023-03-17 DIAGNOSIS — M25561 Pain in right knee: Secondary | ICD-10-CM | POA: Diagnosis not present

## 2023-03-17 DIAGNOSIS — Z23 Encounter for immunization: Secondary | ICD-10-CM | POA: Diagnosis not present

## 2023-03-17 DIAGNOSIS — Z1211 Encounter for screening for malignant neoplasm of colon: Secondary | ICD-10-CM

## 2023-03-17 LAB — HEPATIC FUNCTION PANEL
ALT: 23 U/L (ref 0–53)
AST: 23 U/L (ref 0–37)
Albumin: 4.4 g/dL (ref 3.5–5.2)
Alkaline Phosphatase: 59 U/L (ref 39–117)
Bilirubin, Direct: 0.1 mg/dL (ref 0.0–0.3)
Total Bilirubin: 0.5 mg/dL (ref 0.2–1.2)
Total Protein: 7.3 g/dL (ref 6.0–8.3)

## 2023-03-17 LAB — PSA: PSA: 0.6 ng/mL (ref 0.10–4.00)

## 2023-03-17 LAB — LIPID PANEL
Cholesterol: 223 mg/dL — ABNORMAL HIGH (ref 0–200)
HDL: 51.6 mg/dL (ref >39.00)
LDL Cholesterol: 151 mg/dL — ABNORMAL HIGH (ref 0–99)
NonHDL: 171.33
Total CHOL/HDL Ratio: 4
Triglycerides: 102 mg/dL (ref 0.0–149.0)
VLDL: 20.4 mg/dL (ref 0.0–40.0)

## 2023-03-17 LAB — CBC WITH DIFFERENTIAL/PLATELET
Basophils Absolute: 0.1 10*3/uL (ref 0.0–0.1)
Basophils Relative: 1.1 % (ref 0.0–3.0)
Eosinophils Absolute: 0.1 10*3/uL (ref 0.0–0.7)
Eosinophils Relative: 1.5 % (ref 0.0–5.0)
HCT: 50.2 % (ref 39.0–52.0)
Hemoglobin: 16.7 g/dL (ref 13.0–17.0)
Lymphocytes Relative: 22.5 % (ref 12.0–46.0)
Lymphs Abs: 1.4 10*3/uL (ref 0.7–4.0)
MCHC: 33.2 g/dL (ref 30.0–36.0)
MCV: 94.9 fL (ref 78.0–100.0)
Monocytes Absolute: 0.7 10*3/uL (ref 0.1–1.0)
Monocytes Relative: 11.5 % (ref 3.0–12.0)
Neutro Abs: 3.8 10*3/uL (ref 1.4–7.7)
Neutrophils Relative %: 63.4 % (ref 43.0–77.0)
Platelets: 205 10*3/uL (ref 150.0–400.0)
RBC: 5.29 Mil/uL (ref 4.22–5.81)
RDW: 14.2 % (ref 11.5–15.5)
WBC: 6 10*3/uL (ref 4.0–10.5)

## 2023-03-17 LAB — BASIC METABOLIC PANEL
BUN: 14 mg/dL (ref 6–23)
CO2: 27 meq/L (ref 19–32)
Calcium: 9.4 mg/dL (ref 8.4–10.5)
Chloride: 104 meq/L (ref 96–112)
Creatinine, Ser: 1.01 mg/dL (ref 0.40–1.50)
GFR: 79.4 mL/min (ref >60.00)
Glucose, Bld: 108 mg/dL — ABNORMAL HIGH (ref 70–99)
Potassium: 4.4 meq/L (ref 3.5–5.1)
Sodium: 140 meq/L (ref 135–145)

## 2023-03-17 LAB — HEMOGLOBIN A1C: Hgb A1c MFr Bld: 6.3 % (ref 4.6–6.5)

## 2023-03-17 MED ORDER — ZOLPIDEM TARTRATE 10 MG PO TABS: ORAL_TABLET | ORAL | 1 refills | Status: DC

## 2023-03-17 NOTE — Progress Notes (Addendum)
 Established Patient Office Visit  Subjective   Patient ID: [name]., male    DOB: 31-Oct-1960  Age: 63 y.o. MRN: [id number]  Chief Complaint  Patient presents with   Annual Exam    Pt reports he is fasting. Needs a refill on Ambien.     HPI   Shaquile is here for physical exam.  Not seen in over 2 years.  He stays busy caring for his wife who has some dementia.  He has history of hyperlipidemia and prediabetes range blood sugars.  He has intermittent insomnia and takes Ambien for that and requesting refill  He is having some bilateral knee pains and would like to see sports medicine for that.  He has upcoming trip to [place] with grandson this fall and would like to have his knees "straightened out "before then.  No recent knee x-rays.  He does have history of colon polyps and is due for follow-up colonoscopy now.  He has had prior clinical shingles but would like to get Shingrix vaccine.  He declines any other vaccinations today.   Social history-married.  1 stepson.  Wife has Alzheimer's disease.  Patient quit smoking 2011 and does participate with annual low-dose CT lung cancer screening.  Infrequent alcohol use.  Family history-Sister with breast cancer.  Father had pulmonary fibrosis.  Mother diastolic heart failure.  Past Medical History:  Diagnosis Date   COPD, mild (HCC)    History of anal fissures    History of colon polyps    Right hydrocele    Seasonal allergies    Past Surgical History:  Procedure Laterality Date   COLONOSCOPY  last one 09-17-2016   EVALUATION UNDER ANESTHESIA WITH ANAL FISTULECTOMY N/A 12/09/2012   Procedure: EXAM UNDER ANESTHESIA, FISTULOTOMY;  Surgeon: Romie Levee, MD;  Location: [facility];  Service: General;  Laterality: N/A;   EXAMINATION UNDER ANESTHESIA N/A 08/03/2012   (anal fistula) placement of seton drain; Dr Chevis Pretty    EXCISION [facility] BODY AND GRANULOMA RIGHT INDEX FINGER  07-18-2005   HYDROCELE EXCISION Right  01/30/2017   Procedure: HYDROCELECTOMY ADULT;  Surgeon: Marcine Matar, MD;  Location: [facility];  Service: Urology;  Laterality: Right;   ORIF LEFT ANKLE FX  1981   HARDWARE REMOVED     reports that he quit smoking about 13 years ago. His smoking use included cigarettes. He started smoking about 48 years ago. He has a 35 pack-year smoking history. He has never used smokeless tobacco. He reports current alcohol use of about 6.0 standard drinks of alcohol per week. He reports that he does not use drugs. family history includes Cancer in his sister and another family member; Diabetes in an other family member; Heart disease in his mother; Mental illness in an other family member; Pulmonary fibrosis in his father. Allergies  Allergen Reactions   Adhesive [Tape] Other (See Comments)    Skin blister red   Latex Other (See Comments)    "skin irritated"   Other Other (See Comments)    Skin blister red   Penicillins Rash     Review of Systems  Constitutional:  Negative for chills, fever, malaise/fatigue and weight loss.  HENT:  Negative for hearing loss.   Eyes:  Negative for blurred vision and double vision.  Respiratory:  Negative for cough and shortness of breath.   Cardiovascular:  Negative for chest pain, palpitations and leg swelling.  Gastrointestinal:  Negative for abdominal pain, blood in stool, constipation and  diarrhea.  Genitourinary:  Negative for dysuria.  Skin:  Negative for rash.  Neurological:  Negative for dizziness, speech change, seizures, loss of consciousness and headaches.  Psychiatric/Behavioral:  Negative for depression.       Objective:     BP 102/66 (BP Location: Left Arm, Patient Position: Sitting, Cuff Size: Large)   Pulse 73   Temp 98.1 F (36.7 C) (Oral)   Ht 6\' 2"  (1.88 m)   Wt 228 lb 9.6 oz (103.7 kg)   SpO2 93%   BMI 29.35 kg/m  BP Readings from Last 3 Encounters:  03/17/23 102/66  09/26/20 120/70  09/23/20 (!) 151/85    Wt Readings from Last 3 Encounters:  03/17/23 228 lb 9.6 oz (103.7 kg)  07/28/22 213 lb (96.6 kg)  07/25/21 205 lb (93 kg)      Physical Exam Vitals reviewed.  Constitutional:      General: He is not in acute distress.    Appearance: He is well-developed. He is not ill-appearing.  HENT:     Right Ear: External ear normal.     Left Ear: External ear normal.  Eyes:     Pupils: Pupils are equal, round, and reactive to light.  Neck:     Thyroid: No thyromegaly.  Cardiovascular:     Rate and Rhythm: Normal rate and regular rhythm.  Pulmonary:     Effort: Pulmonary effort is normal. No respiratory distress.     Breath sounds: Normal breath sounds. No wheezing or rales.  Abdominal:     Comments: Umbilical hernia which is soft and nontender.  No other abdominal masses.  Abdomen is soft and nontender.  Musculoskeletal:     Cervical back: Neck supple.     Right lower leg: No edema.     Left lower leg: No edema.  Neurological:     Mental Status: He is alert and oriented to person, place, and time.     Cranial Nerves: No cranial nerve deficit.      No results found for any visits on 03/17/23.    The 10-year ASCVD risk score (Arnett DK, et al., 2019) is: 6.6%    Assessment & Plan:   Problem List Items Addressed This Visit   None Visit Diagnoses       Physical exam    -  Primary   Relevant Orders   CBC with Differential/Platelet   Lipid panel   Basic metabolic panel   Hepatic function panel   PSA     Need for shingles vaccine       Relevant Orders   Zoster Recombinant (Shingrix )     Screening for colon cancer       Relevant Orders   Ambulatory referral to Gastroenterology     Hyperglycemia       Relevant Orders   Hemoglobin A1c     Pain in both knees, unspecified chronicity       Relevant Orders   Ambulatory referral to Sports Medicine      -Patient due for repeat colonoscopy.  He agrees to setting this up -Set up sports medicine referral for  intermittent bilateral knee pain -Patient would like to proceed with shingles vaccine.  Reviewed potential side effects.  Reminder for booster in 2 to 6 months -Discussed pneumonia vaccination he declines.  Would certainly recommend by 65 if not before then -Will obtain follow-up labs as above -Continue annual low-dose lung cancer screening    Evelena Peat, MD

## 2023-03-18 NOTE — Progress Notes (Unsigned)
 [name], PhD, LAT, ATC acting as a scribe for [name], MD.  [name]. is a 63 y.o. male who presents to [facility] at [facility] today for bilat knee pain x ***. He has a trip planned for this fall to [place] w/ his grandson. Pt locates pain to ***  Knee swelling: Mechanical symptoms: Aggravates: Treatments tried:  Pertinent review of systems: ***  Relevant historical information: ***   Exam:  There were no vitals taken for this visit. General: Well Developed, well nourished, and in no acute distress.   MSK: ***    Lab and Radiology Results Results for orders placed or performed in visit on 03/17/23 (from the past 72 hours)  PSA     Status: None   Collection Time: 03/17/23  9:22 AM  Result Value Ref Range   PSA 0.60 0.10 - 4.00 ng/mL    Comment: Test performed using Access Hybritech PSA Assay, a parmagnetic partical, chemiluminecent immunoassay.  Hemoglobin A1c     Status: None   Collection Time: 03/17/23  9:22 AM  Result Value Ref Range   Hgb A1c MFr Bld 6.3 4.6 - 6.5 %    Comment: Glycemic Control Guidelines for People with Diabetes:Non Diabetic:  <6%Goal of Therapy: <7%Additional Action Suggested:  >8%   Hepatic function panel     Status: None   Collection Time: 03/17/23  9:22 AM  Result Value Ref Range   Total Bilirubin 0.5 0.2 - 1.2 mg/dL   Bilirubin, Direct 0.1 0.0 - 0.3 mg/dL   Alkaline Phosphatase 59 39 - 117 U/L   AST 23 0 - 37 U/L   ALT 23 0 - 53 U/L   Total Protein 7.3 6.0 - 8.3 g/dL   Albumin 4.4 3.5 - 5.2 g/dL  Basic metabolic panel     Status: Abnormal   Collection Time: 03/17/23  9:22 AM  Result Value Ref Range   Sodium 140 135 - 145 mEq/L   Potassium 4.4 3.5 - 5.1 mEq/L   Chloride 104 96 - 112 mEq/L   CO2 27 19 - 32 mEq/L   Glucose, Bld 108 (H) 70 - 99 mg/dL   BUN 14 6 - 23 mg/dL   Creatinine, Ser 1.61 0.40 - 1.50 mg/dL   GFR 09.60 >45.40 mL/min    Comment: Calculated using the CKD-EPI Creatinine Equation  (2021)   Calcium 9.4 8.4 - 10.5 mg/dL  Lipid panel     Status: Abnormal   Collection Time: 03/17/23  9:22 AM  Result Value Ref Range   Cholesterol 223 (H) 0 - 200 mg/dL    Comment: ATP III Classification       Desirable:  < 200 mg/dL               Borderline High:  200 - 239 mg/dL          High:  > = 981 mg/dL   Triglycerides 191.4 0.0 - 149.0 mg/dL    Comment: Normal:  <782 mg/dLBorderline High:  150 - 199 mg/dL   HDL 95.62 >13.08 mg/dL   VLDL 65.7 0.0 - 84.6 mg/dL   LDL Cholesterol 962 (H) 0 - 99 mg/dL   Total CHOL/HDL Ratio 4     Comment:                Men          Women1/2 Average Risk     3.4  3.3Average Risk          5.0          4.42X Average Risk          9.6          7.13X Average Risk          15.0          11.0                       NonHDL 171.33     Comment: NOTE:  Non-HDL goal should be 30 mg/dL higher than patient's LDL goal (i.e. LDL goal of < 70 mg/dL, would have non-HDL goal of < 100 mg/dL)  CBC with Differential/Platelet     Status: None   Collection Time: 03/17/23  9:22 AM  Result Value Ref Range   WBC 6.0 4.0 - 10.5 K/uL   RBC 5.29 4.22 - 5.81 Mil/uL   Hemoglobin 16.7 13.0 - 17.0 g/dL   HCT 16.1 09.6 - 04.5 %   MCV 94.9 78.0 - 100.0 fl   MCHC 33.2 30.0 - 36.0 g/dL   RDW 40.9 81.1 - 91.4 %   Platelets 205.0 150.0 - 400.0 K/uL   Neutrophils Relative % 63.4 43.0 - 77.0 %   Lymphocytes Relative 22.5 12.0 - 46.0 %   Monocytes Relative 11.5 3.0 - 12.0 %   Eosinophils Relative 1.5 0.0 - 5.0 %   Basophils Relative 1.1 0.0 - 3.0 %   Neutro Abs 3.8 1.4 - 7.7 K/uL   Lymphs Abs 1.4 0.7 - 4.0 K/uL   Monocytes Absolute 0.7 0.1 - 1.0 K/uL   Eosinophils Absolute 0.1 0.0 - 0.7 K/uL   Basophils Absolute 0.1 0.0 - 0.1 K/uL   No results found.     Assessment and Plan: 63 y.o. male with ***   PDMP not reviewed this encounter. No orders of the defined types were placed in this encounter.  No orders of the defined types were placed in this  encounter.    Discussed warning signs or symptoms. Please see discharge instructions. Patient expresses understanding.   ***

## 2023-03-19 ENCOUNTER — Other Ambulatory Visit: Payer: Self-pay

## 2023-03-19 ENCOUNTER — Ambulatory Visit: Payer: 59 | Admitting: Family Medicine

## 2023-03-19 ENCOUNTER — Encounter: Payer: Self-pay | Admitting: Family Medicine

## 2023-03-19 ENCOUNTER — Ambulatory Visit (INDEPENDENT_AMBULATORY_CARE_PROVIDER_SITE_OTHER): Payer: 59

## 2023-03-19 VITALS — BP 126/82 | HR 68 | Ht 74.0 in | Wt 228.0 lb

## 2023-03-19 DIAGNOSIS — M170 Bilateral primary osteoarthritis of knee: Secondary | ICD-10-CM | POA: Diagnosis not present

## 2023-03-19 DIAGNOSIS — M25561 Pain in right knee: Secondary | ICD-10-CM

## 2023-03-19 DIAGNOSIS — M25562 Pain in left knee: Secondary | ICD-10-CM

## 2023-03-19 DIAGNOSIS — G8929 Other chronic pain: Secondary | ICD-10-CM

## 2023-03-19 DIAGNOSIS — M1711 Unilateral primary osteoarthritis, right knee: Secondary | ICD-10-CM | POA: Diagnosis not present

## 2023-03-19 NOTE — Patient Instructions (Addendum)
 Thank you for coming in today.  Please get an Xray today before you leave  Please use Voltaren gel (Generic Diclofenac Gel) up to 4x daily for pain as needed.  This is available over-the-counter as both the name brand Voltaren gel and the generic diclofenac gel.   You received an injection today. Seek immediate medical attention if the joint becomes red, extremely painful, or is oozing fluid.   Check back in 3 months

## 2023-04-06 ENCOUNTER — Encounter: Payer: Self-pay | Admitting: Family Medicine

## 2023-04-06 NOTE — Progress Notes (Signed)
 Left knee x-ray shows mild arthritis.

## 2023-04-06 NOTE — Progress Notes (Signed)
 Right knee x-ray shows mild arthritis.

## 2023-04-30 ENCOUNTER — Ambulatory Visit (AMBULATORY_SURGERY_CENTER)

## 2023-04-30 ENCOUNTER — Encounter

## 2023-04-30 VITALS — Ht 74.0 in | Wt 225.0 lb

## 2023-04-30 DIAGNOSIS — Z860100 Personal history of colon polyps, unspecified: Secondary | ICD-10-CM

## 2023-04-30 MED ORDER — SUTAB 1479-225-188 MG PO TABS: 24.0000 | ORAL_TABLET | ORAL | 0 refills | Status: DC

## 2023-04-30 NOTE — Progress Notes (Signed)
 Pre visit completed via phone call; Patient verified name, DOB, and address; No egg or soy allergy known to patient;  No issues known to pt with past sedation with any surgeries or procedures; Patient denies ever being told they had issues or difficulty with intubation;  No FH of Malignant Hyperthermia; Pt is not on diet pills; Pt is not on home 02;  Pt is not on blood thinners;  Pt denies issues with constipation;  No A fib or A flutter; Have any cardiac testing pending--NO Insurance verified during PV appt--- [employer] Pt can ambulate without assistance;  Pt denies use of chewing tobacco; Discussed diabetic/weight loss medication holds; Discussed NSAID holds; Checked BMI to be less than 50; Pt instructed to use Single[employer] or GoodRx for a price reduction on prep;  Patient's chart reviewed by Cathlyn Parsons CNRA prior to previsit and patient appropriate for the [facility];  Pre visit completed and red dot placed by patient's name on their procedure day (on provider's schedule);  Instructions sent to MyChart per patient request;

## 2023-05-14 ENCOUNTER — Encounter: Payer: Self-pay | Admitting: Gastroenterology

## 2023-05-17 ENCOUNTER — Encounter: Payer: Self-pay | Admitting: Certified Registered Nurse Anesthetist

## 2023-05-20 ENCOUNTER — Encounter: Payer: Self-pay | Admitting: Gastroenterology

## 2023-05-20 ENCOUNTER — Ambulatory Visit (AMBULATORY_SURGERY_CENTER): Admitting: Gastroenterology

## 2023-05-20 VITALS — BP 134/74 | HR 60 | Temp 98.0°F | Resp 10 | Ht 74.0 in | Wt 225.0 lb

## 2023-05-20 DIAGNOSIS — K635 Polyp of colon: Secondary | ICD-10-CM | POA: Diagnosis not present

## 2023-05-20 DIAGNOSIS — K648 Other hemorrhoids: Secondary | ICD-10-CM

## 2023-05-20 DIAGNOSIS — J449 Chronic obstructive pulmonary disease, unspecified: Secondary | ICD-10-CM | POA: Diagnosis not present

## 2023-05-20 DIAGNOSIS — Z860101 Personal history of adenomatous and serrated colon polyps: Secondary | ICD-10-CM

## 2023-05-20 DIAGNOSIS — K5730 Diverticulosis of large intestine without perforation or abscess without bleeding: Secondary | ICD-10-CM

## 2023-05-20 DIAGNOSIS — D122 Benign neoplasm of ascending colon: Secondary | ICD-10-CM | POA: Diagnosis not present

## 2023-05-20 DIAGNOSIS — Z1211 Encounter for screening for malignant neoplasm of colon: Secondary | ICD-10-CM

## 2023-05-20 DIAGNOSIS — Z860100 Personal history of colon polyps, unspecified: Secondary | ICD-10-CM

## 2023-05-20 MED ORDER — SODIUM CHLORIDE 0.9 % IV SOLN
500.0000 mL | Freq: Once | INTRAVENOUS | Status: DC
Start: 2023-05-20 — End: 2023-05-20

## 2023-05-20 NOTE — Progress Notes (Signed)
 Pt's states no medical or surgical changes since previsit or office visit.

## 2023-05-20 NOTE — Progress Notes (Signed)
 Called to room to assist during endoscopic procedure.  Patient ID and intended procedure confirmed with present staff. Received instructions for my participation in the procedure from the performing physician.

## 2023-05-20 NOTE — Patient Instructions (Signed)

## 2023-05-20 NOTE — Progress Notes (Signed)
 Report given to PACU, vss

## 2023-05-20 NOTE — Op Note (Signed)
 [facility] Patient Name: [name] Procedure Date: 05/20/2023 6:51 AM MRN: [id number] Endoscopist: Ace Abu L. Dominic Friendly , MD, [id number] Age: 63 Referring MD:  Date of Birth: 1960/04/27 Gender: Male Account #: [id number] Procedure:                Colonoscopy Indications:              High risk colon polyp surveillance: Personal                            history of colonic polyps                           two diminutive polypoid lesions removed August 2018                            -normal colon tissue                           > 10mm SSP June 2015 Medicines:                Monitored Anesthesia Care Procedure:                Pre-Anesthesia Assessment:                           - Prior to the procedure, a History and Physical                            was performed, and patient medications and                            allergies were reviewed. The patient's tolerance of                            previous anesthesia was also reviewed. The risks                            and benefits of the procedure and the sedation                            options and risks were discussed with the patient.                            All questions were answered, and informed consent                            was obtained. Prior Anticoagulants: The patient has                            taken no anticoagulant or antiplatelet agents. ASA                            Grade Assessment: III - A patient with severe  systemic disease. After reviewing the risks and                            benefits, the patient was deemed in satisfactory                            condition to undergo the procedure.                           After obtaining informed consent, the colonoscope                            was passed under direct vision. Throughout the                            procedure, the patient's blood pressure, pulse, and                            oxygen saturations  were monitored continuously. The                            CF [id number] #[id number] was introduced through the anus                            and advanced to the the terminal ileum, with                            identification of the appendiceal orifice and IC                            valve. The colonoscopy was performed without                            difficulty. The patient tolerated the procedure                            well. The quality of the bowel preparation was                            good. The terminal ileum, ileocecal valve,                            appendiceal orifice, and rectum were photographed. Scope In: 8:06:12 AM Scope Out: 8:20:13 AM Scope Withdrawal Time: 0 hours 12 minutes 29 seconds  Total Procedure Duration: 0 hours 14 minutes 1 second  Findings:                 The perianal and digital rectal examinations were                            normal.                           The terminal ileum appeared normal.  Repeat examination of right colon under NBI                            performed.                           A diminutive polyp was found in the ascending                            colon. The polyp was semi-sessile. The polyp was                            removed with a cold biopsy forceps. Resection and                            retrieval were complete.                           Multiple diverticula were found from distal                            transverse colon to sigmoid colon.                           Internal hemorrhoids were found.                           The exam was otherwise without abnormality on                            direct and retroflexion views. Complications:            No immediate complications. Estimated Blood Loss:     Estimated blood loss was minimal. Impression:               - The examined portion of the ileum was normal.                           - One diminutive polyp in the ascending colon,                             removed with a cold biopsy forceps. Resected and                            retrieved.                           - Diverticulosis from transverse colon to sigmoid                            colon.                           - Internal hemorrhoids.                           - The examination was otherwise normal on direct  and retroflexion views. Recommendation:           - Patient has a contact number available for                            emergencies. The signs and symptoms of potential                            delayed complications were discussed with the                            patient. Return to normal activities tomorrow.                            Written discharge instructions were provided to the                            patient.                           - Resume previous diet.                           - Continue present medications.                           - Await pathology results.                           - Repeat colonoscopy is recommended for                            surveillance. The colonoscopy date will be                            determined after pathology results from today's                            exam become available for review. Katriel Cutsforth L. Dominic Friendly, MD 05/20/2023 8:28:02 AM This report has been signed electronically.

## 2023-05-20 NOTE — Progress Notes (Signed)
 History and Physical:  This patient presents for endoscopic testing for: Encounter Diagnosis  Name Primary?   Hx of colonic polyps Yes    Surveillance colonoscopy for Hx polyps 2 diminutive polyps - nml tissue  - Aug 2018 10mm SSP June 2015 Patient denies chronic abdominal pain, rectal bleeding, constipation or diarrhea.  Patient is otherwise without complaints or active issues today.   Past Medical History: Past Medical History:  Diagnosis Date   COPD, mild (HCC)    History of anal fissures    History of colon polyps    Right hydrocele    Seasonal allergies      Past Surgical History: Past Surgical History:  Procedure Laterality Date   COLONOSCOPY  2018   HD-MAC-tics/   EVALUATION UNDER ANESTHESIA WITH ANAL FISTULECTOMY N/A 12/09/2012   Procedure: EXAM UNDER ANESTHESIA, FISTULOTOMY;  Surgeon: Joyce Nixon, MD;  Location: [facility];  Service: General;  Laterality: N/A;   EXAMINATION UNDER ANESTHESIA N/A 08/03/2012   (anal fistula) placement of seton drain; Dr Lillette Reid    EXCISION [facility] BODY AND GRANULOMA RIGHT INDEX FINGER  07/18/2005   HYDROCELE EXCISION Right 01/30/2017   Procedure: HYDROCELECTOMY ADULT;  Surgeon: Trent Frizzle, MD;  Location: [facility];  Service: Urology;  Laterality: Right;   ORIF LEFT ANKLE FX  1981   HARDWARE REMOVED     Allergies: Allergies  Allergen Reactions   Other Other (See Comments)    Skin blister red   Latex Other (See Comments)    "skin irritated"  "skin irritated"  "skin irritated"   Penicillins Rash   Tape Other (See Comments)    Skin blister red    Outpatient Meds: Current Outpatient Medications  Medication Sig Dispense Refill   Multiple Vitamin (MULTIVITAMIN) tablet Take 1 tablet by mouth daily.     zolpidem  (AMBIEN ) 10 MG tablet Take one tablet by mouth at night as needed for insomnia. 30 tablet 1   Current Facility-Administered Medications  Medication Dose Route Frequency  Provider Last Rate Last Admin   0.9 %  sodium chloride  infusion  500 mL Intravenous Once Danis, Kendel Pesnell L III, MD          ___________________________________________________________________ Objective   Exam:  BP 138/89   Pulse 60   Temp 98 F (36.7 C)   Resp 18   Ht 6\' 2"  (1.88 m)   Wt 225 lb (102.1 kg)   SpO2 95%   BMI 28.89 kg/m   CV: regular , S1/S2 Resp: clear to auscultation bilaterally, normal RR and effort noted GI: soft, no tenderness, with active bowel sounds.   Assessment: Encounter Diagnosis  Name Primary?   Hx of colonic polyps Yes     Plan: Colonoscopy   The benefits and risks of the planned procedure(s) were described in detail with the patient or (when appropriate) their health care proxy.  Risks were outlined as including, but not limited to, bleeding, infection, perforation, adverse medication reaction leading to cardiac or pulmonary decompensation, pancreatitis (if ERCP).  The limitation of incomplete mucosal visualization was also discussed.  No guarantees or warranties were given.  The patient is appropriate for an endoscopic procedure in the ambulatory setting.   - Lorella Roles, MD

## 2023-05-21 ENCOUNTER — Telehealth: Payer: Self-pay

## 2023-05-21 NOTE — Telephone Encounter (Signed)
  Follow up Call-     05/20/2023    7:25 AM  Call back number  Post procedure Call Back phone  # [phone]  Permission to leave phone message Yes     Patient questions:  Do you have a fever, pain , or abdominal swelling? No. Pain Score  0 *  Have you tolerated food without any problems? Yes.    Have you been able to return to your normal activities? Yes.    Do you have any questions about your discharge instructions: Diet   No. Medications  No. Follow up visit  No.  Do you have questions or concerns about your Care? No.  Actions: * If pain score is 4 or above: No action needed, pain <4.

## 2023-05-22 ENCOUNTER — Encounter: Payer: Self-pay | Admitting: Gastroenterology

## 2023-05-22 LAB — SURGICAL PATHOLOGY

## 2023-06-30 ENCOUNTER — Other Ambulatory Visit: Payer: Self-pay | Admitting: Family Medicine

## 2023-06-30 DIAGNOSIS — F5101 Primary insomnia: Secondary | ICD-10-CM | POA: Diagnosis not present

## 2023-06-30 DIAGNOSIS — I872 Venous insufficiency (chronic) (peripheral): Secondary | ICD-10-CM | POA: Diagnosis not present

## 2023-06-30 DIAGNOSIS — Z880 Allergy status to penicillin: Secondary | ICD-10-CM | POA: Diagnosis not present

## 2023-06-30 DIAGNOSIS — Z87891 Personal history of nicotine dependence: Secondary | ICD-10-CM | POA: Diagnosis not present

## 2023-07-29 ENCOUNTER — Ambulatory Visit (HOSPITAL_BASED_OUTPATIENT_CLINIC_OR_DEPARTMENT_OTHER)
Admission: RE | Admit: 2023-07-29 | Discharge: 2023-07-29 | Disposition: A | Discharge status: Home / Self Care | Payer: Self-pay | Source: Ambulatory Visit | Attending: Acute Care | Admitting: Acute Care

## 2023-07-29 DIAGNOSIS — Z87891 Personal history of nicotine dependence: Secondary | ICD-10-CM | POA: Diagnosis not present

## 2023-07-29 DIAGNOSIS — Z122 Encounter for screening for malignant neoplasm of respiratory organs: Secondary | ICD-10-CM | POA: Diagnosis not present

## 2023-08-04 ENCOUNTER — Other Ambulatory Visit: Payer: Self-pay

## 2023-08-04 DIAGNOSIS — Z87891 Personal history of nicotine dependence: Secondary | ICD-10-CM

## 2023-08-04 DIAGNOSIS — Z122 Encounter for screening for malignant neoplasm of respiratory organs: Secondary | ICD-10-CM

## 2023-09-16 NOTE — Progress Notes (Unsigned)
 I, Claretha Schimke am a scribe for Dr. Artist Lloyd, MD.  [name]. is a 63 y.o. male who presents to [facility] at [facility] today for exacerbation of his L knee pain. Pt was last seen by Dr. Lloyd on 03/19/23 and was given bilat knee steroid injections and was advised to work on quad strengthening and use Voltaren gel  Today, pt reports that right knee is worse than the left knee today. Recent flare up has been a about a week. Going on a long road trip and wants to be proactive. Would like to get both injected today if possible.   Dx imaging: 03/19/23 R & L knee XR  Pertinent review of systems: No fevers or chills  Relevant historical information: Prediabetes   Exam:  BP 106/60   Pulse 62   Ht 6' 2 (1.88 m)   Wt 222 lb (100.7 kg)   SpO2 95%   BMI 28.50 kg/m  General: Well Developed, well nourished, and in no acute distress.   MSK: Knees bilaterally mild effusion normal motion with crepitation.    Lab and Radiology Results  Procedure: Real-time Ultrasound Guided Injection of right knee joint superior lateral patella space Device: Philips Affiniti 50G/GE Logiq Images permanently stored and available for review in PACS Verbal informed consent obtained.  Discussed risks and benefits of procedure. Warned about infection, bleeding, hyperglycemia damage to structures among others. Patient expresses understanding and agreement Time-out conducted.   Noted no overlying erythema, induration, or other signs of local infection.   Skin prepped in a sterile fashion.   Local anesthesia: Topical Ethyl chloride.   With sterile technique and under real time ultrasound guidance: 40 mg of Kenalog and 2 mL of Marcaine  injected into knee joint. Fluid seen entering the joint capsule.   Completed without difficulty   Pain immediately resolved suggesting accurate placement of the medication.   Advised to call if fevers/chills, erythema, induration, drainage, or persistent  bleeding.   Images permanently stored and available for review in the ultrasound unit.  Impression: Technically successful ultrasound guided injection.   Procedure: Real-time Ultrasound Guided Injection of left knee joint superior lateral patella space Device: Philips Affiniti 50G/GE Logiq Images permanently stored and available for review in PACS Verbal informed consent obtained.  Discussed risks and benefits of procedure. Warned about infection, bleeding, hyperglycemia damage to structures among others. Patient expresses understanding and agreement Time-out conducted.   Noted no overlying erythema, induration, or other signs of local infection.   Skin prepped in a sterile fashion.   Local anesthesia: Topical Ethyl chloride.   With sterile technique and under real time ultrasound guidance: 40 mg of Kenalog and 2 mL of Marcaine  injected into knee joint. Fluid seen entering the joint capsule.   Completed without difficulty   Pain immediately resolved suggesting accurate placement of the medication.   Advised to call if fevers/chills, erythema, induration, drainage, or persistent bleeding.   Images permanently stored and available for review in the ultrasound unit.  Impression: Technically successful ultrasound guided injection.       Assessment and Plan: 63 y.o. 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  Plan for steroid injection bilaterally.  Check back as needed.   PDMP not reviewed this encounter. Orders Placed This Encounter  Procedures   US  LIMITED JOINT SPACE STRUCTURES LOW BILAT(NO LINKED CHARGES)    Reason for Exam (SYMPTOM  OR DIAGNOSIS REQUIRED):   knee pain    Preferred imaging location?:  [facility]   No orders of the defined types were placed in this encounter.    Discussed warning signs or symptoms. Please see discharge instructions. Patient expresses understanding.   The above documentation has been reviewed and is accurate  and complete Artist Lloyd, M.D.

## 2023-09-17 ENCOUNTER — Other Ambulatory Visit: Payer: Self-pay

## 2023-09-17 ENCOUNTER — Ambulatory Visit: Admitting: Family Medicine

## 2023-09-17 VITALS — BP 106/60 | HR 62 | Ht 74.0 in | Wt 222.0 lb

## 2023-09-17 DIAGNOSIS — M25562 Pain in left knee: Secondary | ICD-10-CM

## 2023-09-17 DIAGNOSIS — M170 Bilateral primary osteoarthritis of knee: Secondary | ICD-10-CM | POA: Diagnosis not present

## 2023-09-17 DIAGNOSIS — M25561 Pain in right knee: Secondary | ICD-10-CM

## 2023-09-17 DIAGNOSIS — G8929 Other chronic pain: Secondary | ICD-10-CM

## 2023-09-17 NOTE — Patient Instructions (Addendum)
Thank you for coming in today.   Call or go to the ER if you develop a large red swollen joint with extreme pain or oozing puss.    Return as needed.  

## 2023-10-26 ENCOUNTER — Other Ambulatory Visit: Payer: Self-pay | Admitting: Family Medicine

## 2023-11-03 ENCOUNTER — Encounter: Payer: Self-pay | Admitting: Family Medicine

## 2023-11-03 ENCOUNTER — Telehealth: Payer: Self-pay

## 2023-11-03 ENCOUNTER — Other Ambulatory Visit (HOSPITAL_COMMUNITY): Payer: Self-pay

## 2023-11-03 DIAGNOSIS — F5104 Psychophysiologic insomnia: Secondary | ICD-10-CM | POA: Insufficient documentation

## 2023-11-03 NOTE — Telephone Encounter (Signed)
 Pharmacy Patient Advocate Encounter   Received notification from Pt Calls Messages that prior authorization for Zolpidem  10 is required/requested.   Insurance verification completed.   The patient is insured through [facility].   Per test claim: PA required; PA started via CoverMyMeds. KEY [id number] . Waiting for clinical questions to populate.

## 2023-11-03 NOTE — Telephone Encounter (Signed)
 Noted

## 2023-11-03 NOTE — Telephone Encounter (Signed)
 Patient is needing PA for Zolpidem  10 mg for more than 15 tablets per month.

## 2023-11-03 NOTE — Telephone Encounter (Signed)
Pharmacy informed of approval.

## 2023-11-03 NOTE — Telephone Encounter (Signed)
 Pharmacy Patient Advocate Encounter  Received notification from [facility] that Prior Authorization for Zolpidem  10 has been APPROVED from 11/03/23 to 11/02/24. Ran test claim, Copay is $1.02. This test claim was processed through [facility]- copay amounts may vary at other pharmacies due to pharmacy/plan contracts, or as the patient moves through the different stages of their insurance plan.   PA #/Case ID/Reference #: # [id number]

## 2023-11-17 ENCOUNTER — Ambulatory Visit: Admitting: Family Medicine

## 2023-11-17 VITALS — BP 122/82 | HR 69 | Ht 74.0 in | Wt 222.0 lb

## 2023-11-17 DIAGNOSIS — M79651 Pain in right thigh: Secondary | ICD-10-CM | POA: Diagnosis not present

## 2023-11-17 MED ORDER — PREDNISONE 50 MG PO TABS: 50.0000 mg | ORAL_TABLET | Freq: Every day | ORAL | 0 refills | Status: DC

## 2023-11-17 NOTE — Progress Notes (Signed)
   I, Leotis Batter, CMA acting as a scribe for Artist Lloyd, MD.  [name]. is a 63 y.o. male who presents to [facility] at [facility] today for LBP. Pt was previously seen by Dr. Lloyd on 09/17/23 for bilat knee pain.  Today, pt c/o LBP x 1-2 months. Pt locates pain to right lower back, tightness in the right leg, does not feel like the leg is moving well / gait feels off.   He is leaving on November 14 for a trip to [place].  Radiating pain: R LE LE numbness/tingling: denies LE weakness: denies Aggravates: ambulation, standing. Treatments tried: Motrin prn.   Pertinent review of systems: No fevers or chills  Relevant historical information: Prediabetes   Exam:  BP 122/82   Pulse 69   Ht 6' 2 (1.88 m)   Wt 222 lb (100.7 kg)   SpO2 97%   BMI 28.50 kg/m  General: Well Developed, well nourished, and in no acute distress.   MSK: Right hip normal-appearing normal motion and intact strength some pain with resisted knee flexion and hip extension.  L-spine nontender to palpation normal lumbar motion.    Lab and Radiology Results No results found for this or any previous visit (from the past 72 hours). No results found.     Assessment and Plan: 63 y.o. male with right posterior thigh pain.  Differential includes hamstring strain or piriformis syndrome or even lumbar radiculopathy.  Hamstring strain is the most likely explanation.  Plan for home exercise program.  Continue Tylenol  and NSAIDs as needed.  I did prescribe prednisone for an emergency backup.  Could prescribe tramadol for emergency backup as well if needed.   PDMP reviewed during this encounter. No orders of the defined types were placed in this encounter.  Meds ordered this encounter  Medications   predniSONE (DELTASONE) 50 MG tablet    Sig: Take 1 tablet (50 mg total) by mouth daily.    Dispense:  5 tablet    Refill:  0     Discussed warning signs or symptoms. Please see discharge  instructions. Patient expresses understanding.   The above documentation has been reviewed and is accurate and complete Artist Lloyd, M.D.

## 2023-11-17 NOTE — Patient Instructions (Addendum)
 Thank you for coming in today.   Please work on the home exercises the athletic trainer went over with you:  View at [email or web address] code [email or web address]  Use tylenol  and ibuprofen if needed.   If worse ok to take prednisone.   Let me know if you need tramadol.   Schedule with me the week of Nov 10 and cancel if feeling better.

## 2023-12-02 ENCOUNTER — Other Ambulatory Visit: Payer: Self-pay

## 2023-12-02 ENCOUNTER — Ambulatory Visit: Admitting: Family Medicine

## 2023-12-02 VITALS — BP 120/60 | HR 79 | Ht 74.0 in | Wt 218.4 lb

## 2023-12-02 DIAGNOSIS — M25561 Pain in right knee: Secondary | ICD-10-CM | POA: Diagnosis not present

## 2023-12-02 DIAGNOSIS — M170 Bilateral primary osteoarthritis of knee: Secondary | ICD-10-CM

## 2023-12-02 DIAGNOSIS — G8929 Other chronic pain: Secondary | ICD-10-CM | POA: Diagnosis not present

## 2023-12-02 DIAGNOSIS — M25562 Pain in left knee: Secondary | ICD-10-CM | POA: Diagnosis not present

## 2023-12-02 MED ORDER — TRAMADOL HCL 50 MG PO TABS: 50.0000 mg | ORAL_TABLET | Freq: Three times a day (TID) | ORAL | 0 refills | Status: AC | PRN

## 2023-12-02 NOTE — Progress Notes (Signed)
 I, [name] am a scribe for Dr. Artist Lloyd, MD.  [name]. is a 63 y.o. male who presents to [facility] at [facility] today for f/u R thigh pain. Pt was last seen by Dr. Lloyd on 11/17/23 and was prescribed prednisone, taught HEP, and advised to cont Tylenol /NSAIDs prn.  He is leaving on November 14 for a trip to [place].   Today, pt reports that the hamstring is still tight. Have been trying the exercises. Doesn't think that it is doing much. Wants knee injections today and have been hurting him for about a couple weeks ago.    Pertinent review of systems: No fevers or chills  Relevant historical information: Insomnia   Exam:  BP 120/60   Pulse 79   Ht 6' 2 (1.88 m)   Wt 218 lb 6.4 oz (99.1 kg)   SpO2 95%   BMI 28.04 kg/m  General: Well Developed, well nourished, and in no acute distress.   MSK: Bilateral knees mild effusion normal-appearing otherwise normal motion.    Lab and Radiology Results  Procedure: Real-time Ultrasound Guided Injection of right knee joint superior lateral patella space Device: Philips Affiniti 50G/GE Logiq Images permanently stored and available for review in PACS Verbal informed consent obtained.  Discussed risks and benefits of procedure. Warned about infection, bleeding, hyperglycemia damage to structures among others. Patient expresses understanding and agreement Time-out conducted.   Noted no overlying erythema, induration, or other signs of local infection.   Skin prepped in a sterile fashion.   Local anesthesia: Topical Ethyl chloride.   With sterile technique and under real time ultrasound guidance: 40 mg of Kenalog and 2 mL of Marcaine  injected into knee joint. Fluid seen entering the joint capsule.   Completed without difficulty   Pain immediately resolved suggesting accurate placement of the medication.   Advised to call if fevers/chills, erythema, induration, drainage, or persistent bleeding.   Images  permanently stored and available for review in the ultrasound unit.  Impression: Technically successful ultrasound guided injection.   Procedure: Real-time Ultrasound Guided Injection of left knee joint superior lateral patella space Device: Philips Affiniti 50G/GE Logiq Images permanently stored and available for review in PACS Verbal informed consent obtained.  Discussed risks and benefits of procedure. Warned about infection, bleeding, hyperglycemia damage to structures among others. Patient expresses understanding and agreement Time-out conducted.   Noted no overlying erythema, induration, or other signs of local infection.   Skin prepped in a sterile fashion.   Local anesthesia: Topical Ethyl chloride.   With sterile technique and under real time ultrasound guidance: 40 mg of Kenalog and 2 mL of Marcaine  injected into knee joint. Fluid seen entering the joint capsule.   Completed without difficulty   Pain immediately resolved suggesting accurate placement of the medication.   Advised to call if fevers/chills, erythema, induration, drainage, or persistent bleeding.   Images permanently stored and available for review in the ultrasound unit.  Impression: Technically successful ultrasound guided injection.       Assessment and Plan: 63 y.o. male with bilateral knee pain due to DJD.  Plan for steroid injection both knees today.  He still has some residual hamstrings pain from his last visit.  I did prescribe him prednisone which she has available to take while traveling if needed.  Additionally prescribed today tramadol that he can also use while traveling if needed.  Check back as needed.   PDMP not reviewed this encounter. Orders Placed This Encounter  Procedures   US  LIMITED JOINT SPACE STRUCTURES LOW BILAT(NO LINKED CHARGES)    Reason for Exam (SYMPTOM  OR DIAGNOSIS REQUIRED):   knee pain    Preferred imaging location?:   [facility] ordered  this encounter  Medications   traMADol (ULTRAM) 50 MG tablet    Sig: Take 1 tablet (50 mg total) by mouth every 8 (eight) hours as needed for severe pain (pain score 7-10).    Dispense:  15 tablet    Refill:  0     Discussed warning signs or symptoms. Please see discharge instructions. Patient expresses understanding.   The above documentation has been reviewed and is accurate and complete Artist Lloyd, M.D.

## 2023-12-02 NOTE — Patient Instructions (Signed)
 Thank you for coming in today.   You received an injection today. Seek immediate medical attention if the joint becomes red, extremely painful, or is oozing fluid.   Check back as needed  Have a fabulous trip!

## 2023-12-15 ENCOUNTER — Encounter: Payer: Self-pay | Admitting: Family Medicine

## 2023-12-15 ENCOUNTER — Ambulatory Visit (INDEPENDENT_AMBULATORY_CARE_PROVIDER_SITE_OTHER): Admitting: Family Medicine

## 2023-12-15 VITALS — BP 134/70 | HR 74 | Temp 98.2°F | Wt 214.3 lb

## 2023-12-15 DIAGNOSIS — N529 Male erectile dysfunction, unspecified: Secondary | ICD-10-CM | POA: Diagnosis not present

## 2023-12-15 MED ORDER — TADALAFIL 20 MG PO TABS: 20.0000 mg | ORAL_TABLET | ORAL | 5 refills | Status: AC | PRN

## 2023-12-15 NOTE — Progress Notes (Signed)
 Established Patient Office Visit  Subjective   Patient ID: [name]., male    DOB: 20-Dec-1960  Age: 63 y.o. MRN: [id number]  Chief Complaint  Patient presents with   Personal Problem    HPI   [name] is seen to discuss recent ED issues.  Recently met a new girlfriend.  He tried some type of over-the-counter Cialis  but is not sure of the milligrams.  This was through a mail order.  Helped some but he did not get great erection with that.  He does not smoke.  No history of diabetes.  Does have history of prediabetes.  No history of nitroglycerin use.  No known peripheral vascular disease.  Past Medical History:  Diagnosis Date   COPD, mild (HCC)    History of anal fissures    History of colon polyps    Right hydrocele    Seasonal allergies    Past Surgical History:  Procedure Laterality Date   COLONOSCOPY  2018   HD-MAC-tics/   EVALUATION UNDER ANESTHESIA WITH ANAL FISTULECTOMY N/A 12/09/2012   Procedure: EXAM UNDER ANESTHESIA, FISTULOTOMY;  Surgeon: Bernarda Ned, MD;  Location: [facility];  Service: General;  Laterality: N/A;   EXAMINATION UNDER ANESTHESIA N/A 08/03/2012   (anal fistula) placement of seton drain; Dr Deward Null    EXCISION [facility] BODY AND GRANULOMA RIGHT INDEX FINGER  07/18/2005   HYDROCELE EXCISION Right 01/30/2017   Procedure: HYDROCELECTOMY ADULT;  Surgeon: Matilda Senior, MD;  Location: [facility];  Service: Urology;  Laterality: Right;   ORIF LEFT ANKLE FX  1981   HARDWARE REMOVED     reports that he quit smoking about 14 years ago. His smoking use included cigarettes. He started smoking about 49 years ago. He has a 35 pack-year smoking history. He has never used smokeless tobacco. He reports current alcohol use of about 12.0 standard drinks of alcohol per week. He reports that he does not use drugs. family history includes Cancer in his sister and another family member; Diabetes in an other family member; Heart disease  in his mother; Mental illness in an other family member; Pulmonary fibrosis in his father. Allergies  Allergen Reactions   Other Other (See Comments)    Skin blister red   Latex Other (See Comments)    skin irritated  skin irritated  skin irritated   Penicillins Rash   Tape Other (See Comments)    Skin blister red    Review of Systems  Constitutional:  Negative for malaise/fatigue.  Eyes:  Negative for blurred vision.  Respiratory:  Negative for shortness of breath.   Cardiovascular:  Negative for chest pain.  Neurological:  Negative for dizziness, weakness and headaches.      Objective:     BP 134/70   Pulse 74   Temp 98.2 F (36.8 C) (Oral)   Wt 214 lb 4.8 oz (97.2 kg)   SpO2 95%   BMI 27.51 kg/m  BP Readings from Last 3 Encounters:  12/15/23 134/70  12/02/23 120/60  11/17/23 122/82   Wt Readings from Last 3 Encounters:  12/15/23 214 lb 4.8 oz (97.2 kg)  12/02/23 218 lb 6.4 oz (99.1 kg)  11/17/23 222 lb (100.7 kg)      Physical Exam Vitals reviewed.  Constitutional:      General: He is not in acute distress.    Appearance: He is not ill-appearing.  Cardiovascular:     Rate and Rhythm: Normal rate and regular rhythm.  Pulmonary:     Effort: Pulmonary effort is normal.     Breath sounds: Normal breath sounds. No wheezing or rales.  Neurological:     Mental Status: He is alert.      No results found for any visits on 12/15/23.    The 10-year ASCVD risk score (Arnett DK, et al., 2019) is: 12.6%    Assessment & Plan:   Erectile dysfunction.  We discussed trial of Cialis  20 mg every other day as needed.  Caution not to combine with nitroglycerin.  Be in touch if not seeing any benefit from this.  He has physical scheduled actually for Monday and will discuss then and get feedback.  Wolm Scarlet, MD

## 2023-12-21 ENCOUNTER — Ambulatory Visit: Admitting: Family Medicine

## 2023-12-21 VITALS — BP 118/60 | HR 85 | Temp 98.1°F | Ht 72.0 in | Wt 210.6 lb

## 2023-12-21 DIAGNOSIS — Z0000 Encounter for general adult medical examination without abnormal findings: Secondary | ICD-10-CM | POA: Diagnosis not present

## 2023-12-21 DIAGNOSIS — Z23 Encounter for immunization: Secondary | ICD-10-CM

## 2023-12-21 NOTE — Progress Notes (Signed)
 Established Patient Office Visit  Subjective   Patient ID: [name]., male    DOB: Dec 04, 1960  Age: 63 y.o. MRN: [id number]  Chief Complaint  Patient presents with   Annual Exam    HPI   [name] is seen today for complete physical.  He has history of hyperlipidemia, prediabetes, chronic insomnia.  He has some knee arthritis and sees orthopedics and has had some injections in his knees.  Overall doing fairly well.  Quit smoking several years ago.  Participates with yearly low-dose CT lung cancer screening.  He does bring up question of possible right abdominal wall hernia just right of umbilical area.  He has occasional bulge and soreness in this area but not consistently.  Health maintenance reviewed.  Tetanus up-to-date.  Colonoscopy up-to-date.  He declines pneumonia vaccine.  He had 1 Shingrix  and declines getting second at this time.  Does consent to influenza vaccine.  Social history-he is married.  Wife has Alzheimer's disease and recently went in nursing care.  Patient quit smoking 2011.  Infrequent alcohol use.  Has 1 stepson.  Family history-Sister with breast cancer.  Father had pulmonary fibrosis.  Mother diastolic heart failure.  No family history of type 2 diabetes  Past Medical History:  Diagnosis Date   COPD, mild (HCC)    History of anal fissures    History of colon polyps    Right hydrocele    Seasonal allergies    Past Surgical History:  Procedure Laterality Date   COLONOSCOPY  2018   HD-MAC-tics/   EVALUATION UNDER ANESTHESIA WITH ANAL FISTULECTOMY N/A 12/09/2012   Procedure: EXAM UNDER ANESTHESIA, FISTULOTOMY;  Surgeon: Bernarda Ned, MD;  Location: [facility];  Service: General;  Laterality: N/A;   EXAMINATION UNDER ANESTHESIA N/A 08/03/2012   (anal fistula) placement of seton drain; Dr Deward Null    EXCISION [facility] BODY AND GRANULOMA RIGHT INDEX FINGER  07/18/2005   HYDROCELE EXCISION Right 01/30/2017   Procedure: HYDROCELECTOMY ADULT;   Surgeon: Matilda Senior, MD;  Location: [facility];  Service: Urology;  Laterality: Right;   ORIF LEFT ANKLE FX  1981   HARDWARE REMOVED     reports that he quit smoking about 14 years ago. His smoking use included cigarettes. He started smoking about 49 years ago. He has a 35 pack-year smoking history. He has never used smokeless tobacco. He reports current alcohol use of about 12.0 standard drinks of alcohol per week. He reports that he does not use drugs. family history includes Cancer in his sister and another family member; Diabetes in an other family member; Heart disease in his mother; Mental illness in an other family member; Pulmonary fibrosis in his father. Allergies  Allergen Reactions   Other Other (See Comments)    Skin blister red   Latex Other (See Comments)    skin irritated  skin irritated  skin irritated   Penicillins Rash   Tape Other (See Comments)    Skin blister red      Review of Systems  Constitutional:  Negative for chills, fever, malaise/fatigue and weight loss.  HENT:  Negative for hearing loss.   Eyes:  Negative for blurred vision and double vision.  Respiratory:  Negative for cough and shortness of breath.   Cardiovascular:  Negative for chest pain, palpitations and leg swelling.  Gastrointestinal:  Negative for abdominal pain, blood in stool, constipation and diarrhea.  Genitourinary:  Negative for dysuria.  Skin:  Negative for rash.  Neurological:  Negative for dizziness, speech change, seizures, loss of consciousness and headaches.  Psychiatric/Behavioral:  Negative for depression.       Objective:     BP 118/60 (BP Location: Left Arm, Patient Position: Sitting, Cuff Size: Large)   Pulse 85   Temp 98.1 F (36.7 C) (Oral)   Ht 6' (1.829 m)   Wt 210 lb 9.6 oz (95.5 kg)   SpO2 96%   BMI 28.56 kg/m  BP Readings from Last 3 Encounters:  12/21/23 118/60  12/15/23 134/70  12/02/23 120/60   Wt Readings from Last 3  Encounters:  12/21/23 210 lb 9.6 oz (95.5 kg)  12/15/23 214 lb 4.8 oz (97.2 kg)  12/02/23 218 lb 6.4 oz (99.1 kg)      Physical Exam Vitals reviewed.  Constitutional:      General: He is not in acute distress.    Appearance: He is well-developed.  HENT:     Head: Normocephalic and atraumatic.     Right Ear: External ear normal.     Left Ear: External ear normal.  Eyes:     Conjunctiva/sclera: Conjunctivae normal.     Pupils: Pupils are equal, round, and reactive to light.  Neck:     Thyroid : No thyromegaly.  Cardiovascular:     Rate and Rhythm: Normal rate and regular rhythm.     Heart sounds: Normal heart sounds. No murmur heard. Pulmonary:     Effort: No respiratory distress.     Breath sounds: No wheezing or rales.  Abdominal:     General: Bowel sounds are normal. There is no distension.     Palpations: Abdomen is soft.     Tenderness: There is no abdominal tenderness. There is no guarding or rebound.     Comments: Small umbilical hernia which is soft and nontender.  No other definite hernia masses palpated.  Musculoskeletal:     Cervical back: Normal range of motion and neck supple.     Right lower leg: No edema.     Left lower leg: No edema.  Lymphadenopathy:     Cervical: No cervical adenopathy.  Skin:    Findings: No rash.  Neurological:     Mental Status: He is alert and oriented to person, place, and time.     Cranial Nerves: No cranial nerve deficit.      No results found for any visits on 12/21/23.    The 10-year ASCVD risk score (Arnett DK, et al., 2019) is: 10.2%    Assessment & Plan:   Problem List Items Addressed This Visit   None Visit Diagnoses       Physical exam    -  Primary   Relevant Orders   Basic metabolic panel with GFR   Lipid panel   CBC with Differential/Platelet   Hepatic function panel   Hemoglobin A1c   PSA     Influenza vaccine needed       Relevant Orders   Flu vaccine trivalent PF, 6mos and  older(Flulaval,Afluria,Fluarix,Fluzone)     Physical exam.  Patient requesting labs as above.  He is nonfasting today and those are scheduled for future date couple days from now.  Influenza vaccine given.  We recommended pneumonia vaccine but he declines.  Also mentioned Shingrix  vaccine.  Unfortunately, he is past 44-month window from first and would have to start vaccine series over.  He wishes to defer at this time.  He does plan to continue with annual low-dose CT lung cancer screening.  No follow-ups on file.    Wolm Scarlet, MD

## 2023-12-24 ENCOUNTER — Other Ambulatory Visit

## 2023-12-24 ENCOUNTER — Ambulatory Visit: Payer: Self-pay | Admitting: Family Medicine

## 2023-12-24 DIAGNOSIS — Z0000 Encounter for general adult medical examination without abnormal findings: Secondary | ICD-10-CM | POA: Diagnosis not present

## 2023-12-24 DIAGNOSIS — R972 Elevated prostate specific antigen [PSA]: Secondary | ICD-10-CM

## 2023-12-24 LAB — BASIC METABOLIC PANEL WITH GFR
BUN: 13 mg/dL (ref 6–23)
CO2: 30 meq/L (ref 19–32)
Calcium: 9.1 mg/dL (ref 8.4–10.5)
Chloride: 99 meq/L (ref 96–112)
Creatinine, Ser: 1 mg/dL (ref 0.40–1.50)
GFR: 79.91 mL/min (ref >60.00)
Glucose, Bld: 99 mg/dL (ref 70–99)
Potassium: 4.4 meq/L (ref 3.5–5.1)
Sodium: 137 meq/L (ref 135–145)

## 2023-12-24 LAB — HEPATIC FUNCTION PANEL
ALT: 24 U/L (ref 0–53)
AST: 24 U/L (ref 0–37)
Albumin: 4.3 g/dL (ref 3.5–5.2)
Alkaline Phosphatase: 53 U/L (ref 39–117)
Bilirubin, Direct: 0.1 mg/dL (ref 0.0–0.3)
Total Bilirubin: 0.6 mg/dL (ref 0.2–1.2)
Total Protein: 6.7 g/dL (ref 6.0–8.3)

## 2023-12-24 LAB — LIPID PANEL
Cholesterol: 162 mg/dL (ref 0–200)
HDL: 65.8 mg/dL (ref >39.00)
LDL Cholesterol: 85 mg/dL (ref 0–99)
NonHDL: 96.49
Total CHOL/HDL Ratio: 2
Triglycerides: 56 mg/dL (ref 0.0–149.0)
VLDL: 11.2 mg/dL (ref 0.0–40.0)

## 2023-12-24 LAB — CBC WITH DIFFERENTIAL/PLATELET
Basophils Absolute: 0.1 K/uL (ref 0.0–0.1)
Basophils Relative: 0.8 % (ref 0.0–3.0)
Eosinophils Absolute: 0 K/uL (ref 0.0–0.7)
Eosinophils Relative: 0.3 % (ref 0.0–5.0)
HCT: 45.6 % (ref 39.0–52.0)
Hemoglobin: 15.4 g/dL (ref 13.0–17.0)
Lymphocytes Relative: 14.5 % (ref 12.0–46.0)
Lymphs Abs: 1.2 K/uL (ref 0.7–4.0)
MCHC: 33.9 g/dL (ref 30.0–36.0)
MCV: 94.9 fl (ref 78.0–100.0)
Monocytes Absolute: 1 K/uL (ref 0.1–1.0)
Monocytes Relative: 12.2 % — ABNORMAL HIGH (ref 3.0–12.0)
Neutro Abs: 6.2 K/uL (ref 1.4–7.7)
Neutrophils Relative %: 72.2 % (ref 43.0–77.0)
Platelets: 207 K/uL (ref 150.0–400.0)
RBC: 4.8 Mil/uL (ref 4.22–5.81)
RDW: 14 % (ref 11.5–15.5)
WBC: 8.6 K/uL (ref 4.0–10.5)

## 2023-12-24 LAB — HEMOGLOBIN A1C: Hgb A1c MFr Bld: 6 % (ref 4.6–6.5)

## 2023-12-24 LAB — PSA: PSA: 2.37 ng/mL (ref 0.10–4.00)

## 2023-12-28 ENCOUNTER — Ambulatory Visit: Admitting: Family Medicine

## 2023-12-28 ENCOUNTER — Encounter: Payer: Self-pay | Admitting: Family Medicine

## 2023-12-28 VITALS — BP 144/60 | HR 83 | Temp 98.7°F | Wt 210.7 lb

## 2023-12-28 DIAGNOSIS — J029 Acute pharyngitis, unspecified: Secondary | ICD-10-CM

## 2023-12-28 DIAGNOSIS — K120 Recurrent oral aphthae: Secondary | ICD-10-CM

## 2023-12-28 DIAGNOSIS — R21 Rash and other nonspecific skin eruption: Secondary | ICD-10-CM

## 2023-12-28 DIAGNOSIS — R197 Diarrhea, unspecified: Secondary | ICD-10-CM

## 2023-12-28 LAB — POCT RAPID STREP A (OFFICE): Rapid Strep A Screen: NEGATIVE

## 2023-12-28 MED ORDER — DOXYCYCLINE HYCLATE 100 MG PO CAPS: 100.0000 mg | ORAL_CAPSULE | Freq: Two times a day (BID) | ORAL | 0 refills | Status: AC

## 2023-12-28 NOTE — Progress Notes (Signed)
 Established Patient Office Visit  Subjective   Patient ID: Carlos Nila., male    DOB: Jun 18, 1960  Age: 63 y.o. MRN: [id number]  Chief Complaint  Patient presents with   Sinusitis   Diarrhea   Rash   Sore Throat    HPI    [name] is seen as a work in with 4 or 5-day history of some nonbloody diarrhea, sore throat, fever last week.  He had some significant chills as well.  He states last Wednesday had a fever 101 and Thursday 101.5.  He also noticed small aphthous ulcer inner lip.  Denies any nausea or vomiting.  No abdominal pain.  He has been taking ibuprofen fairly regularly.  Still has fairly severe sore throat symptoms.  No cough.  No significant nasal congestion.  No facial pain.  Has also noticed couple of small pimple-like lesions on his face.  Fever has resolved since last week. He does also describe some bloody and purulent looking nasal discharge frequently.  No major headache.  Past Medical History:  Diagnosis Date   COPD, mild (HCC)    History of anal fissures    History of colon polyps    Right hydrocele    Seasonal allergies    Past Surgical History:  Procedure Laterality Date   COLONOSCOPY  2018   HD-MAC-tics/   EVALUATION UNDER ANESTHESIA WITH ANAL FISTULECTOMY N/A 12/09/2012   Procedure: EXAM UNDER ANESTHESIA, FISTULOTOMY;  Surgeon: Bernarda Ned, MD;  Location: [facility];  Service: General;  Laterality: N/A;   EXAMINATION UNDER ANESTHESIA N/A 08/03/2012   (anal fistula) placement of seton drain; Dr Deward Null    EXCISION [facility] BODY AND GRANULOMA RIGHT INDEX FINGER  07/18/2005   HYDROCELE EXCISION Right 01/30/2017   Procedure: HYDROCELECTOMY ADULT;  Surgeon: Matilda Senior, MD;  Location: [facility];  Service: Urology;  Laterality: Right;   ORIF LEFT ANKLE FX  1981   HARDWARE REMOVED     reports that he quit smoking about 14 years ago. His smoking use included cigarettes. He started smoking about 49 years ago. He has a 35  pack-year smoking history. He has never used smokeless tobacco. He reports current alcohol use of about 12.0 standard drinks of alcohol per week. He reports that he does not use drugs. family history includes Cancer in his sister and another family member; Diabetes in an other family member; Heart disease in his mother; Mental illness in an other family member; Pulmonary fibrosis in his father. Allergies  Allergen Reactions   Other Other (See Comments)    Skin blister red   Latex Other (See Comments)    skin irritated  skin irritated  skin irritated   Penicillins Rash   Tape Other (See Comments)    Skin blister red    Review of Systems  Constitutional:  Positive for fever.  HENT:  Positive for sore throat.   Respiratory:  Negative for cough and shortness of breath.   Cardiovascular:  Negative for chest pain.  Gastrointestinal:  Positive for diarrhea. Negative for abdominal pain, blood in stool, melena, nausea and vomiting.      Objective:     BP (!) 144/60   Pulse 83   Temp 98.7 F (37.1 C) (Oral)   Wt 210 lb 11.2 oz (95.6 kg)   SpO2 95%   BMI 28.58 kg/m  BP Readings from Last 3 Encounters:  12/28/23 (!) 144/60  12/21/23 118/60  12/15/23 134/70   Wt Readings from Last  3 Encounters:  12/28/23 210 lb 11.2 oz (95.6 kg)  12/21/23 210 lb 9.6 oz (95.5 kg)  12/15/23 214 lb 4.8 oz (97.2 kg)      Physical Exam Vitals reviewed.  Constitutional:      General: He is not in acute distress.    Appearance: He is not ill-appearing or toxic-appearing.  HENT:     Right Ear: Tympanic membrane normal.     Left Ear: Tympanic membrane normal.     Mouth/Throat:     Comments: He has approximately 3 mm superficial aphthous ulcer left lower inner lip.  Posterior pharynx reveals significant erythema. Neck:     Comments: Neck is supple with anterior cervical adenopathy Cardiovascular:     Rate and Rhythm: Normal rate and regular rhythm.  Pulmonary:     Effort: Pulmonary  effort is normal.     Breath sounds: Normal breath sounds. No wheezing or rales.  Skin:    Comments: He has couple small crusted lesions including 1 on the chin and 1 on the distal nose.  No pustules.  Neurological:     Mental Status: He is alert.      No results found for any visits on 12/28/23.    The 10-year ASCVD risk score (Arnett DK, et al., 2019) is: 9.5%    Assessment & Plan:   Seen with onset last week of fever for couple days and some persistent sore throat symptoms.  Fever has resolved.  Also has small aphthous ulcer inner lip and diarrhea symptoms.  Check rapid strep= negative  Constellation of several symptoms as above including sore throat, recent fever, diarrhea, aphthous ulcer inner lip, scattered crusted lesions on his face.  Difficult to put all this together.  Rapid strep was negative.  -Continue supportive therapy with plenty fluids and rest - He is describing some possible sinusitis symptoms and also has had a crop of recent crusted lesions face which could potentially be staph related.  We have elected to cover with doxycycline  100 mg twice daily for 7 days.  Follow-up immediately for any recurrent fever or other concerns.  Handout on appropriate diet to use for diarrhea.  Consider short-term use of over-the-counter Imodium     Wolm Scarlet, MD

## 2023-12-28 NOTE — Patient Instructions (Signed)
 Consider over the counter Imodium as needed for the diarrhea.

## 2024-02-25 ENCOUNTER — Other Ambulatory Visit: Payer: Self-pay | Admitting: Family Medicine
